# Patient Record
Sex: Male | Born: 1970 | Race: White | Hispanic: No | Marital: Single | Smoking: Current every day smoker
Health system: Southern US, Community
[De-identification: ages and names within clinical notes are randomized; demographics above are authoritative.]

## PROBLEM LIST (undated history)

## (undated) DIAGNOSIS — F410 Panic disorder [episodic paroxysmal anxiety] without agoraphobia: Secondary | ICD-10-CM

## (undated) DIAGNOSIS — J45909 Unspecified asthma, uncomplicated: Secondary | ICD-10-CM

## (undated) DIAGNOSIS — F1911 Other psychoactive substance abuse, in remission: Secondary | ICD-10-CM

## (undated) DIAGNOSIS — Z8701 Personal history of pneumonia (recurrent): Secondary | ICD-10-CM

## (undated) HISTORY — DX: Personal history of pneumonia (recurrent): Z87.01

## (undated) HISTORY — DX: Other psychoactive substance abuse, in remission: F19.11

## (undated) HISTORY — DX: Panic disorder (episodic paroxysmal anxiety): F41.0

## (undated) HISTORY — PX: HERNIA REPAIR: SHX51

---

## 2001-09-24 ENCOUNTER — Encounter: Payer: Self-pay | Admitting: *Deleted

## 2001-09-24 ENCOUNTER — Inpatient Hospital Stay (HOSPITAL_COMMUNITY): Admission: EM | Admit: 2001-09-24 | Discharge: 2001-09-25 | Payer: Self-pay | Admitting: *Deleted

## 2002-02-25 ENCOUNTER — Emergency Department (HOSPITAL_COMMUNITY): Admission: EM | Admit: 2002-02-25 | Discharge: 2002-02-26 | Payer: Self-pay | Admitting: Emergency Medicine

## 2002-04-17 ENCOUNTER — Emergency Department (HOSPITAL_COMMUNITY): Admission: EM | Admit: 2002-04-17 | Discharge: 2002-04-17 | Payer: Self-pay | Admitting: Emergency Medicine

## 2002-09-15 ENCOUNTER — Encounter: Payer: Self-pay | Admitting: *Deleted

## 2002-09-15 ENCOUNTER — Emergency Department (HOSPITAL_COMMUNITY): Admission: EM | Admit: 2002-09-15 | Discharge: 2002-09-15 | Payer: Self-pay | Admitting: *Deleted

## 2002-09-28 ENCOUNTER — Inpatient Hospital Stay (HOSPITAL_COMMUNITY): Admission: EM | Admit: 2002-09-28 | Discharge: 2002-09-30 | Payer: Self-pay | Admitting: Emergency Medicine

## 2002-09-28 ENCOUNTER — Encounter: Payer: Self-pay | Admitting: Emergency Medicine

## 2002-09-30 ENCOUNTER — Encounter: Payer: Self-pay | Admitting: *Deleted

## 2003-09-23 ENCOUNTER — Emergency Department (HOSPITAL_COMMUNITY): Admission: EM | Admit: 2003-09-23 | Discharge: 2003-09-23 | Payer: Self-pay | Admitting: Emergency Medicine

## 2003-11-13 ENCOUNTER — Emergency Department (HOSPITAL_COMMUNITY): Admission: EM | Admit: 2003-11-13 | Discharge: 2003-11-13 | Payer: Self-pay | Admitting: Emergency Medicine

## 2003-11-14 ENCOUNTER — Emergency Department (HOSPITAL_COMMUNITY): Admission: EM | Admit: 2003-11-14 | Discharge: 2003-11-14 | Payer: Self-pay | Admitting: Emergency Medicine

## 2006-02-03 ENCOUNTER — Emergency Department (HOSPITAL_COMMUNITY): Admission: EM | Admit: 2006-02-03 | Discharge: 2006-02-04 | Payer: Self-pay | Admitting: Emergency Medicine

## 2006-02-11 ENCOUNTER — Ambulatory Visit: Payer: Self-pay | Admitting: Orthopedic Surgery

## 2006-02-21 ENCOUNTER — Ambulatory Visit: Payer: Self-pay | Admitting: Orthopedic Surgery

## 2007-01-26 ENCOUNTER — Emergency Department (HOSPITAL_COMMUNITY): Admission: EM | Admit: 2007-01-26 | Discharge: 2007-01-26 | Payer: Self-pay | Admitting: Emergency Medicine

## 2007-07-30 ENCOUNTER — Emergency Department (HOSPITAL_COMMUNITY): Admission: EM | Admit: 2007-07-30 | Discharge: 2007-07-30 | Payer: Self-pay | Admitting: Emergency Medicine

## 2008-11-23 ENCOUNTER — Ambulatory Visit: Payer: Self-pay | Admitting: Cardiology

## 2008-11-23 ENCOUNTER — Inpatient Hospital Stay (HOSPITAL_COMMUNITY): Admission: EM | Admit: 2008-11-23 | Discharge: 2008-11-24 | Payer: Self-pay | Admitting: Emergency Medicine

## 2008-11-24 ENCOUNTER — Encounter: Payer: Self-pay | Admitting: Cardiology

## 2008-11-26 ENCOUNTER — Ambulatory Visit: Payer: Self-pay | Admitting: Cardiology

## 2009-05-16 ENCOUNTER — Emergency Department (HOSPITAL_COMMUNITY): Admission: EM | Admit: 2009-05-16 | Discharge: 2009-05-16 | Payer: Self-pay | Admitting: Emergency Medicine

## 2010-02-21 ENCOUNTER — Emergency Department (HOSPITAL_COMMUNITY): Admission: EM | Admit: 2010-02-21 | Discharge: 2010-02-21 | Payer: Self-pay | Admitting: Emergency Medicine

## 2010-02-24 ENCOUNTER — Inpatient Hospital Stay (HOSPITAL_COMMUNITY): Admission: EM | Admit: 2010-02-24 | Discharge: 2010-03-02 | Payer: Self-pay | Admitting: Emergency Medicine

## 2010-02-25 ENCOUNTER — Ambulatory Visit: Payer: Self-pay | Admitting: Pulmonary Disease

## 2010-11-05 LAB — BASIC METABOLIC PANEL
BUN: 5 mg/dL — ABNORMAL LOW (ref 6–23)
BUN: 6 mg/dL (ref 6–23)
BUN: 6 mg/dL (ref 6–23)
BUN: 8 mg/dL (ref 6–23)
CO2: 24 mEq/L (ref 19–32)
CO2: 25 mEq/L (ref 19–32)
CO2: 25 mEq/L (ref 19–32)
CO2: 29 mEq/L (ref 19–32)
Calcium: 8 mg/dL — ABNORMAL LOW (ref 8.4–10.5)
Calcium: 8.5 mg/dL (ref 8.4–10.5)
Calcium: 8.6 mg/dL (ref 8.4–10.5)
Calcium: 8.6 mg/dL (ref 8.4–10.5)
Chloride: 100 mEq/L (ref 96–112)
Chloride: 102 mEq/L (ref 96–112)
Creatinine, Ser: 0.64 mg/dL (ref 0.4–1.5)
Creatinine, Ser: 0.73 mg/dL (ref 0.4–1.5)
Creatinine, Ser: 0.77 mg/dL (ref 0.4–1.5)
GFR calc Af Amer: >60 mL/min (ref >60)
GFR calc non Af Amer: >60 mL/min (ref >60)
GFR calc non Af Amer: >60 mL/min (ref >60)
GFR calc non Af Amer: >60 mL/min (ref >60)
GFR calc non Af Amer: >60 mL/min (ref >60)
Glucose, Bld: 110 mg/dL — ABNORMAL HIGH (ref 70–99)
Glucose, Bld: 111 mg/dL — ABNORMAL HIGH (ref 70–99)
Glucose, Bld: 127 mg/dL — ABNORMAL HIGH (ref 70–99)
Glucose, Bld: 97 mg/dL (ref 70–99)
Glucose, Bld: 99 mg/dL (ref 70–99)
Potassium: 3.6 mEq/L (ref 3.5–5.1)
Potassium: 3.4 mEq/L — ABNORMAL LOW (ref 3.5–5.1)
Potassium: 3.6 mEq/L (ref 3.5–5.1)
Sodium: 130 mEq/L — ABNORMAL LOW (ref 135–145)
Sodium: 133 mEq/L — ABNORMAL LOW (ref 135–145)
Sodium: 137 mEq/L (ref 135–145)
Sodium: 139 mEq/L (ref 135–145)

## 2010-11-05 LAB — CBC
HCT: 34 % — ABNORMAL LOW (ref 39.0–52.0)
HCT: 35.4 % — ABNORMAL LOW (ref 39.0–52.0)
HCT: 35.6 % — ABNORMAL LOW (ref 39.0–52.0)
Hemoglobin: 11.8 g/dL — ABNORMAL LOW (ref 13.0–17.0)
Hemoglobin: 12 g/dL — ABNORMAL LOW (ref 13.0–17.0)
MCH: 32.2 pg (ref 26.0–34.0)
MCH: 32.6 pg (ref 26.0–34.0)
MCH: 32.8 pg (ref 26.0–34.0)
MCH: 33 pg (ref 26.0–34.0)
MCHC: 33.8 g/dL (ref 30.0–36.0)
MCHC: 34.2 g/dL (ref 30.0–36.0)
MCHC: 34.5 g/dL (ref 30.0–36.0)
MCV: 94.9 fL (ref 78.0–100.0)
MCV: 95 fL (ref 78.0–100.0)
MCV: 95.2 fL (ref 78.0–100.0)
MCV: 95.5 fL (ref 78.0–100.0)
Platelets: 207 10*3/uL (ref 150–400)
Platelets: 414 10*3/uL — ABNORMAL HIGH (ref 150–400)
Platelets: 478 10*3/uL — ABNORMAL HIGH (ref 150–400)
RBC: 4.42 MIL/uL (ref 4.22–5.81)
RBC: 3.5 MIL/uL — ABNORMAL LOW (ref 4.22–5.81)
RDW: 12.7 % (ref 11.5–15.5)
RDW: 12.8 % (ref 11.5–15.5)
RDW: 12.9 % (ref 11.5–15.5)
RDW: 12.9 % (ref 11.5–15.5)
WBC: 17.9 10*3/uL — ABNORMAL HIGH (ref 4.0–10.5)
WBC: 16.9 10*3/uL — ABNORMAL HIGH (ref 4.0–10.5)
WBC: 17.9 10*3/uL — ABNORMAL HIGH (ref 4.0–10.5)

## 2010-11-05 LAB — CULTURE, RESPIRATORY W GRAM STAIN

## 2010-11-05 LAB — URINALYSIS, ROUTINE W REFLEX MICROSCOPIC
Glucose, UA: NEGATIVE mg/dL
Ketones, ur: NEGATIVE mg/dL
Leukocytes, UA: NEGATIVE
Nitrite: NEGATIVE
Protein, ur: 100 mg/dL — AB
Specific Gravity, Urine: >1.03 — ABNORMAL HIGH (ref 1.005–1.030)
pH: 5.5 (ref 5.0–8.0)

## 2010-11-05 LAB — BLOOD GAS, ARTERIAL
Acid-Base Excess: 2.5 mmol/L — ABNORMAL HIGH (ref 0.0–2.0)
Bicarbonate: 26.3 mEq/L — ABNORMAL HIGH (ref 20.0–24.0)
FIO2: 0.32 %
O2 Saturation: 92.6 %
TCO2: 27.5 mmol/L (ref 0–100)
pH, Arterial: 7.443 (ref 7.350–7.450)

## 2010-11-05 LAB — COMPREHENSIVE METABOLIC PANEL
ALT: 23 U/L (ref 0–53)
AST: 29 U/L (ref 0–37)
Alkaline Phosphatase: 95 U/L (ref 39–117)
GFR calc Af Amer: >60 mL/min (ref >60)
Glucose, Bld: 127 mg/dL — ABNORMAL HIGH (ref 70–99)
Potassium: 3.4 mEq/L — ABNORMAL LOW (ref 3.5–5.1)
Sodium: 129 mEq/L — ABNORMAL LOW (ref 135–145)
Total Protein: 6 g/dL (ref 6.0–8.3)

## 2010-11-05 LAB — POCT I-STAT 3, ART BLOOD GAS (G3+)
Acid-Base Excess: 2 mmol/L (ref 0.0–2.0)
Bicarbonate: 27.2 mEq/L — ABNORMAL HIGH (ref 20.0–24.0)
O2 Saturation: 83 %
Patient temperature: 99.6
TCO2: 28 mmol/L (ref 0–100)
pCO2 arterial: 41.4 mmHg (ref 35.0–45.0)
pH, Arterial: 7.412 (ref 7.350–7.450)

## 2010-11-05 LAB — CULTURE, BLOOD (ROUTINE X 2): Culture: NO GROWTH

## 2010-11-05 LAB — EXPECTORATED SPUTUM ASSESSMENT W GRAM STAIN, RFLX TO RESP C

## 2010-11-05 LAB — DIFFERENTIAL
Basophils Absolute: 0 10*3/uL (ref 0.0–0.1)
Basophils Relative: 0 % (ref 0–1)
Eosinophils Absolute: 0 10*3/uL (ref 0.0–0.7)
Eosinophils Relative: 0 % (ref 0–5)
Lymphocytes Relative: 4 % — ABNORMAL LOW (ref 12–46)
Lymphs Abs: 0.7 10*3/uL (ref 0.7–4.0)
Lymphs Abs: 0.7 10*3/uL (ref 0.7–4.0)
Monocytes Absolute: 0.4 10*3/uL (ref 0.1–1.0)
Neutro Abs: 16.7 10*3/uL — ABNORMAL HIGH (ref 1.7–7.7)
Neutro Abs: 17 10*3/uL — ABNORMAL HIGH (ref 1.7–7.7)
Neutrophils Relative %: 95 % — ABNORMAL HIGH (ref 43–77)

## 2010-11-05 LAB — HIV ANTIBODY (ROUTINE TESTING W REFLEX): HIV: NONREACTIVE

## 2010-11-05 LAB — URINE MICROSCOPIC-ADD ON

## 2010-11-05 LAB — RAPID URINE DRUG SCREEN, HOSP PERFORMED
Amphetamines: NOT DETECTED
Cocaine: POSITIVE — AB
Tetrahydrocannabinol: NOT DETECTED

## 2010-11-05 LAB — PHOSPHORUS: Phosphorus: 4.6 mg/dL (ref 2.3–4.6)

## 2010-11-05 LAB — MAGNESIUM
Magnesium: 2.2 mg/dL (ref 1.5–2.5)
Magnesium: 2.2 mg/dL (ref 1.5–2.5)

## 2010-11-05 LAB — STREP PNEUMONIAE URINARY ANTIGEN: Strep Pneumo Urinary Antigen: NEGATIVE

## 2010-11-05 LAB — URINE CULTURE
Colony Count: NO GROWTH
Culture: NO GROWTH

## 2010-11-05 LAB — AFB CULTURE WITH SMEAR (NOT AT ARMC)

## 2010-11-29 LAB — COMPREHENSIVE METABOLIC PANEL
ALT: 26 U/L (ref 0–53)
AST: 23 U/L (ref 0–37)
Albumin: 4.1 g/dL (ref 3.5–5.2)
Calcium: 8.9 mg/dL (ref 8.4–10.5)
GFR calc Af Amer: >60 mL/min (ref >60)
Glucose, Bld: 123 mg/dL — ABNORMAL HIGH (ref 70–99)
Potassium: 3.7 mEq/L (ref 3.5–5.1)
Sodium: 135 mEq/L (ref 135–145)
Total Protein: 7 g/dL (ref 6.0–8.3)

## 2010-11-29 LAB — CBC
HCT: 43.3 % (ref 39.0–52.0)
Hemoglobin: 15.1 g/dL (ref 13.0–17.0)
MCHC: 34.8 g/dL (ref 30.0–36.0)
MCHC: 35.4 g/dL (ref 30.0–36.0)
Platelets: 205 10*3/uL (ref 150–400)
Platelets: 209 10*3/uL (ref 150–400)
RDW: 12.7 % (ref 11.5–15.5)
RDW: 13 % (ref 11.5–15.5)

## 2010-11-29 LAB — POCT CARDIAC MARKERS
CKMB, poc: <1 ng/mL — ABNORMAL LOW (ref 1.0–8.0)
Myoglobin, poc: 25 ng/mL (ref 12–200)

## 2010-11-29 LAB — DIFFERENTIAL
Basophils Absolute: 0 10*3/uL (ref 0.0–0.1)
Basophils Relative: 0 % (ref 0–1)
Eosinophils Absolute: 0.2 10*3/uL (ref 0.0–0.7)
Eosinophils Absolute: 0.2 10*3/uL (ref 0.0–0.7)
Eosinophils Relative: 2 % (ref 0–5)
Lymphs Abs: 2.4 10*3/uL (ref 0.7–4.0)
Monocytes Absolute: 0.6 10*3/uL (ref 0.1–1.0)
Monocytes Absolute: 0.6 10*3/uL (ref 0.1–1.0)
Monocytes Relative: 7 % (ref 3–12)
Neutrophils Relative %: 61 % (ref 43–77)

## 2010-11-29 LAB — BASIC METABOLIC PANEL
BUN: 9 mg/dL (ref 6–23)
CO2: 28 mEq/L (ref 19–32)
GFR calc non Af Amer: >60 mL/min (ref >60)
Glucose, Bld: 103 mg/dL — ABNORMAL HIGH (ref 70–99)
Potassium: 4 mEq/L (ref 3.5–5.1)
Sodium: 138 mEq/L (ref 135–145)

## 2010-11-29 LAB — RAPID URINE DRUG SCREEN, HOSP PERFORMED
Amphetamines: NOT DETECTED
Barbiturates: NOT DETECTED
Benzodiazepines: NOT DETECTED
Tetrahydrocannabinol: NOT DETECTED

## 2010-11-29 LAB — TSH: TSH: 1.947 u[IU]/mL (ref 0.350–4.500)

## 2010-11-29 LAB — LIPID PANEL
HDL: 26 mg/dL — ABNORMAL LOW (ref >39)
Total CHOL/HDL Ratio: 9.5 RATIO
Triglycerides: 424 mg/dL — ABNORMAL HIGH (ref <150)
Triglycerides: 429 mg/dL — ABNORMAL HIGH (ref <150)
VLDL: UNDETERMINED mg/dL (ref 0–40)
VLDL: UNDETERMINED mg/dL (ref 0–40)

## 2010-11-29 LAB — CARDIAC PANEL(CRET KIN+CKTOT+MB+TROPI)
Relative Index: INVALID (ref 0.0–2.5)
Relative Index: INVALID (ref 0.0–2.5)
Total CK: 72 U/L (ref 7–232)
Troponin I: 0.01 ng/mL (ref 0.00–0.06)
Troponin I: 0.02 ng/mL (ref 0.00–0.06)

## 2010-11-29 LAB — D-DIMER, QUANTITATIVE: D-Dimer, Quant: 0.22 ug/mL-FEU (ref 0.00–0.48)

## 2011-01-02 NOTE — Discharge Summary (Signed)
[name], [name] NO.:  [id number]   MEDICAL RECORD NO.:  [id number]          PATIENT TYPE:  INP   LOCATION:  A312                          FACILITY:  [facility]   PHYSICIAN:  Dorris Singh, DO    DATE OF BIRTH:  Jul 10, 1971   DATE OF ADMISSION:  11/23/2008  DATE OF DISCHARGE:  04/07/2010LH                               DISCHARGE SUMMARY   ADMISSION DIAGNOSES:  1. Chest pain.  2. History of anxiety and disorder.  3. History of polysubstance abuse.  4. History of asthma.  5. History of a negative Cardiolite study in 2004.   DISCHARGE DIAGNOSES:  1. Chest pain, atypical.  2. History of tobacco abuse.  3. History of polysubstance abuse.  4. Positive for opiates this admission.  5. Asthma.  6. History of anxiety disorder.   PRIMARY CARE PHYSICIAN:  Patrica Duel, M.D.   CONSULTATIONS:  Gerrit Friends. Dietrich Pates, MD, [facility].   RADIOLOGY TESTING:  On April 6, he had a two view chest x-ray which  demonstrated mild bronchitic changes.  Also, on April 7, he had a 2-D  echo, reading is pending.   HOSPITAL COURSE:  The patient was admitted for the above diagnoses.  He  was admitted to the service of [facility] and placed on the medical floor  with telemetry.  His cardiac enzymes were monitored.  [facility]  was consulted.  The patient was placed on morphine for pain and placed  on DVT and GI prophylaxis.  He continued to improve.  His chest pain  lessened as the day wore on.  He also had a 2-D echo as noted above with  results pending.  Cardiology saw him and felt this was atypical, but  would like to stress him in the office after being discharged within two  days.  At this point in time, it was determined per Cardiology's  recommendation that he could be sent home.   DISCHARGE MEDICATIONS:  The patient will be sent home on the following  medications which include:  1. Lexapro 20 mg one p.o. daily.  2. Aspirin 81 mg p.o. daily.  3. Protonix 40 mg.   DISCHARGE  INSTRUCTIONS:  1. Increase activity slowly.  2. Eat a low sodium, heart-healthy diet.  3. Stop smoking.   PLAN:  He is counseled about his polysubstance abuse.  He is to have a  stress test with Dr. Dietrich Pates on Friday.  He is to take his medications  as directed and to return if symptoms worsen.   CONDITION ON DISCHARGE:  Stable.   DISPOSITION:  To home.   TOTAL TIME OF DISCHARGE:  Thirty minutes.      Dorris Singh, DO  Electronically Signed     CB/MEDQ  D:  11/24/2008  T:  11/24/2008  Job:  [id number]

## 2011-01-02 NOTE — H&P (Signed)
[name]                ACCOUNT NO.:  [id number]   MEDICAL RECORD NO.:  [id number]          PATIENT TYPE:  INP   LOCATION:  A312                          FACILITY:  [facility]   PHYSICIAN:  [name], M.D.DATE OF BIRTH:  03-01-1971   DATE OF ADMISSION:  11/23/2008  DATE OF DISCHARGE:  LH                              HISTORY & PHYSICAL   ADMISSION DIAGNOSIS:  1. Chest pain rule out myocardial infarction.  2. History of anxiety disorder.  3. History of polysubstance abuse.  4. History of asthma.  5. History of negative Cardiolite study in 2004.   CHIEF COMPLAINT:  Chest pain.   HISTORY OF PRESENT ILLNESS:  [name] is a 40 year old male who  presented to the emergency room with complaints of some left-sided chest  pain.   The patient has had this complaint multiple times dating back to 2003.  As far as the records show his pain has been very atypical. He says this  pain is a little bit different.  He says it is mostly on the left side  of his chest and substernal with some pressure with discomfort.  He  denies any nausea or vomiting.  No diaphoresis.  The patient says his  pain was 10 out of 10 at its worst and was nonradiating.  There was no  numbness in his jaw or arms.   He denies any nausea, vomiting.  No abdominal pain.  The patient denies  any history of acid reflux.  Evaluation was done in the emergency room  with no real abnormality found.  His cardiac enzymes were negative.  The  patient reports that his pain is now down to 5/10.  He denies any cough.  No pleuritic like chest pain.   REVIEW OF SYSTEMS:  As mentioned in history of  present illness.  No  weight loss.   PAST MEDICAL HISTORY:  1. Asthma.  2. Prior history of atypical chest pain with negative workup.  3. Polysubstance abuse including cocaine, cannabis, alcohol and      cigarettes.  4. Anxiety and depression.   MEDICATIONS:  The patient does not take any medicines at this time.   ALLERGIES:  He has no known drug allergies.   FAMILY HISTORY:  Noncontributory.   SOCIAL HISTORY:  The patient is a heavy drinker.  He uses marijuana but  denies any recent use of cocaine.  He continues to smoke about one to  two packs of cigarettes a day.   PHYSICAL EXAMINATION:  The patient was conscious, alert, comfortable,  not in acute distress, well-oriented in time, place and person.  VITAL SIGNS:  Blood pressure is 124/71, pulse of 67, respirations 20,  temperature 98 degrees Fahrenheit, oxygen saturation was 95% on room  air.  HEENT:  Exam normocephalic, atraumatic.  Oral mucosa was moist with no  exudates.  NECK:  Supple.  No JVD or lymphadenopathy.  LUNGS:  Clear with good air entry bilaterally.  Heart:  S1, S2, regular.  No S3, gallops or rubs.  ABDOMEN:  Soft, nontender.  Bowel sounds positive.  No masses palpable.  EXTREMITIES:  No edema.  No calf induration or tenderness was noted.  CNS:  Grossly intact with no focal neurological deficits.   LABORATORY AND DIAGNOSTIC DATA:  White blood cell count 8.2, hemoglobin  of 15, hematocrit of 42.5, platelet count was 209 with no left shift.  Sodium is 135, potassium is 3.7, chloride of 102, CO2 was 24, glucose  123, BUN of 12, creatinine was 0.84, AST is 23, ALT of 26, albumin is  4.1, calcium is  8.9.  Cardiac enzymes were negative.  Cardiac markers  were negative.   A 12-lead EKG shows normal sinus rhythm with no acute ST-T changes.  Chest x-ray showed mild bronchitic changes with no acute infiltrates or  effusion noted.   ASSESSMENT AND PLAN:  [name] is a 40 year old male who presents to  the emergency room with chest pain.  the chest pain appears to be  somewhat atypical however  the patient is describing the pain as worse,  as 10 at of 10 and much worse than previous when he had a negative  Cardiolite study.   PLAN:  1. We will admit the patient to the medical floor.  2. Will put the patient on telemetry.  3.  Will cycle his cardiac enzymes.  4. Will request cardiology consult in the morning.  5. Will control pain with morphine as needed.  6. Will put the patient on DVT prophylaxis with Lovenox as I do not      see any clear indication for full anticoagulation at this time.   I have discussed the above plan with the patient.  He verbalized  understanding.  I have also requested a urine drug screen.      [name], M.D.  Electronically Signed     AM/MEDQ  D:  11/23/2008  T:  11/23/2008  Job:  [id number]

## 2011-01-02 NOTE — Consult Note (Signed)
NAMEDEMARYIUS, IMRAN NO.:  [id number]   MEDICAL RECORD NO.:  [id number]          PATIENT TYPE:  INP   LOCATION:  A312                          FACILITY:  [facility]   PHYSICIAN:  Gerrit Friends. Dietrich Pates, MD, FACCDATE OF BIRTH:  [birth year]   DATE OF CONSULTATION:  11/24/2008  DATE OF DISCHARGE:  11/24/2008                                 CONSULTATION   REFERRING PHYSICIAN:  Margaretmary Dys, MD   PRIMARY CARE PHYSICIAN:  Patrica Duel, MD   REASON FOR CONSULTATION:  Chest pain.   HISTORY OF PRESENT ILLNESS:  Mr. Pilger is a 40 year old male patient  with a history of polysubstance abuse, evaluated by Dr. Dorethea Clan in 2004  with cocaine-induced chest pain.  He underwent a Cardiolite study at  that time that demonstrated no ischemia.  He presented to [facility] Room yesterday morning with complaints of right-sided  chest pain.  He described it as a heaviness/pressure.  It began at rest  in the morning shortly after awakening.  He thinks he was slightly short  of breath.  He notes associated nausea and diaphoresis, but not syncope.  He felt like he was also off balance.  He noted a headache in the  occipital region in association with his symptoms.  He continued to feel  worse and finally presented to the emergency room.  No aggravating  factors have been described.  He thinks morphine made the pain feel  better.  He does not recall receiving nitroglycerin in the emergency  room.  His pain has continued to recur since admission with intermittent  relief with morphine sulfate.  Cardiac markers have been negative x3.  We are now asked to further evaluate.   PAST MEDICAL HISTORY:  1. History of chest pain in the setting of cocaine abuse in 2003.  2. Anxiety/depression.  3. History polysubstance abuse.  4. Asthma.  5. Adenosine Cardiolite in February 2004, demonstrating apical      thinning, but no ischemia, EF 61%.  6. Echocardiogram in October 2004,  demonstrate mild LVH, EF 65-70%,      slight left atrial enlargement.  7. Status post left inguinal hernia repair.   MEDICATIONS AT HOME:  Lexapro 20 mg daily.   ALLERGIES:  No known drug allergies.   SOCIAL HISTORY:  The patient lives in [place] with his mother.  He  has a 30+ pack-year history smoking, continues to smoke 1-1/2 packs of  cigarettes per day.  He drinks 6-12 beers per day.  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  He denies any  cocaine abuse in several years.  He continues to admit to marijuana  abuse.   FAMILY HISTORY:  Significant for COPD in his father.  No premature CAD  as noted.  His brother was deceased, but did have tetralogy of Fallot.   REVIEW OF SYSTEMS:  Please see HPI.  Denies fevers, sore throat, rash,  dysuria, hematuria, bright red blood per rectum or melena, dysphasia, or  GERD symptoms.  He has noted some abdominal  pain.  He notes some mild  shortness of breath with exertion without significant change.  He  describes NYHA class II to class IIIB symptoms.  He denies orthopnea,  PND, or pedal edema.  Denies cough.  All other systems reviewed and  negative.   PHYSICAL EXAMINATION:  GENERAL:  He is a well-nourished, well-developed  male in distress.  VITAL SIGNS:  Blood pressure is 124/72, pulse 60, respirations 20,  temperature 97, and oxygen saturation 99% on room air.  HEENT:  Normal.  NECK:  Without JVD.  LYMPH:  Without lymphadenopathy.  ENDOCRINE:  Without thyromegaly.  CARDIAC:  Normal S1 and S2.  Regular rate and rhythm without murmur.  LUNGS:  Clear to auscultation bilaterally.  No wheezing, no rhonchi, no  rales.  SKIN:  Warm and dry without rash.  ABDOMEN:  Soft, nontender with normoactive bowel sounds.  No  organomegaly.  EXTREMITIES:  Without clubbing, cyanosis, or edema.  MUSCULOSKELETAL:  Without joint deformity.  NEUROLOGIC:  He is alert and oriented x3.  Cranial nerves II-XII grossly   intact.  VASCULAR:  No carotid bruits noted bilaterally.  Dorsalis pedis and  posterior tibialis pulses are 2+ bilaterally.   Chest x-ray with mild bronchitic changes.  EKG within normal limits.   LABORATORY DATA:  White count 10,900, hemoglobin 15.1, MCV 95, platelet  count 205,000, potassium 4, creatinine 0.83, and albumin 4.1.  Cardiac  markers negative x3.  Urine drug screen positive for opiates.   ASSESSMENT:  1. Chest pain in a 40 year old male with past history of cocaine abuse      and continuous tobacco abuse as well as alcohol abuse.  Myocardial      infarction has been ruled out.  2. Anxiety/depression.   RECOMMENDATIONS:  The patient presents with mostly atypical chest pain.  He is ruled out for myocardial function by enzymes with negative cardiac  markers.  His chest pain has been ongoing for 24 hours.  An  echocardiogram will be obtained as well as lipid panel and TSH and a D-  dimer.  The patient was also interviewed  and examined by Dr. Dietrich Pates.  After further review, Dr. Dietrich Pates has  felt the patient is stable enough for discharge to home.  He will be set  up for an outpatient stress test to further evaluate.  Thank you very  much for the consultation.      Tereso Newcomer, PA-C      Gerrit Friends. Dietrich Pates, MD, [facility]  Electronically Signed    SW/MEDQ  D:  11/26/2008  T:  11/26/2008  Job:  [id number]   cc:   Patrica Duel, M.D.  Fax: [phone]   Margaretmary Dys, M.D.

## 2011-01-02 NOTE — Group Therapy Note (Signed)
NAMEGERTRUDE, [name] NO.:  [id number]   MEDICAL RECORD NO.:  [id number]          PATIENT TYPE:  INP   LOCATION:  A312                          FACILITY:  [facility]   PHYSICIAN:  Johnny Singh, DO    DATE OF BIRTH:  May 28, 1971   DATE OF PROCEDURE:  11/24/2008  DATE OF DISCHARGE:                                 PROGRESS NOTE   The patient is seen today.  He is still complaining of some chest  discomfort.  He had an echo done and we are awaiting any further  recommendations via cardiology.   PHYSICAL EXAMINATION:  VITAL SIGNS:  His vitals are as follows.  Temperature 98.1, pulse 60, respirations 20 and blood pressure 124/72.  GENERAL:  Generally he is well-developed, well-nourished and in no acute  distress.  HEART:  Regular rate and rhythm.  LUNGS:  Clear to auscultation bilaterally.  ABDOMEN:  Soft, nontender, nondistended.  EXTREMITIES:  Positive pulses.  No edema, ecchymosis or cyanosis.   CURRENT LABS:  White count of 10.9, hemoglobin 15.1, hematocrit 43.3 and  platelet count 205.  He did test positive for opiates and his sodium is  138, potassium 4.0, chloride 105, CO2 28, glucose 103, BUN 9, creatinine  0.83.   ASSESSMENT AND PLAN:  1. Chest pain, rule out myocardial infarction.  So far all of his      troponins have been negative.  2. Anxiety disorder seems to be stable.  3. History of polysubstance abuse.  4. History of asthma.   Will continue to monitor and await any further recommendations that  cardiology may have for him.      Johnny Singh, DO  Electronically Signed     CB/MEDQ  D:  11/24/2008  T:  11/24/2008  Job:  [id number]

## 2011-01-02 NOTE — Procedures (Signed)
[facility]                              EXERCISE TREADMILL   [name], [name]                       MRN:          [id number]  DATE:11/26/2008                            DOB:          12-24-1970    PRIMARY CARE PHYSICIAN:  Patrica Duel, M.D.   REASON FOR STRESS TEST:  Chest pain.   HISTORY OF PRESENT ILLNESS:  Mr. Frith is a 40 year old male patient  with a history of tobacco abuse and alcohol abuse who had previously  abused cocaine, but denies any cocaine use in quite sometime who  presented to [facility] recently with chest pain.  It was  atypical, and he ruled out for myocardial infarction by enzymes.  His D-  dimer was normal.  His TSH was normal.  Chest x-ray was unremarkable.  Echocardiogram demonstrated normal LV function.  His lipid panel was  abnormal with elevated triglycerides over 400 and an HDL under 30.   EXERCISE TREADMILL TEST:  The patient exercised according to Bruce  Protocol for 10 minutes and achieved a work level of 11.8 METS.  His  resting heart rate of 86 rose to a maximum of 166 beats per minute,  which represented 91% of his maximal age predicted heart rate.  Resting  blood pressure is 140/82 and rose to a maximum of 188/72.  Exercise was  stopped due to achieving greater than 85% of his maximum potential heart  rate as well as exhaustion.  The patient complained of chest pain at the  beginning of the test.  This was slightly worsened during the test.  He  also noticed shortness of breath with exertion.   FINDINGS:  His baseline electrocardiogram demonstrated sinus rhythm with  a heart rate of 89, normal axis, no acute changes.  He had no  significant ST-T wave changes during exercise to suggest ischemia or  injury.  He had no significant arrhythmias in recovery.   IMPRESSION:  1. Electrically negative exercise treadmill test.  2. Tobacco abuse.  3. Alcohol abuse.  4. Dyslipidemia with elevated  triglycerides and low HDL.   RECOMMENDATIONS:  No further cardiovascular workup in warranted.  The  patient's exercise treadmill test was reviewed with Dr. Dietrich Pates today.  He can follow up with Cardiology on a p.r.n. basis.  He has been asked  to reduce his alcohol intake, and have his lipids rechecked with his  primary care physician within 3 months.  He should follow up with Dr.  Nobie Putnam for further evaluation and management of his chest discomfort.   DISPOSITION:  Follow up with Cardiology on a p.r.n. basis.      Tereso Newcomer, PA-C  Electronically Signed      Gerrit Friends. Dietrich Pates, MD, [facility]  Electronically Signed   SW/MedQ  DD: 11/26/2008  DT: 11/27/2008  Job #: [id number]   cc:   Patrica Duel, M.D.

## 2011-01-05 NOTE — Procedures (Signed)
   NAME:  [name], [name]                          ACCOUNT NO.:  [id number]   MEDICAL RECORD NO.:  [id number]                   PATIENT TYPE:   LOCATION:                                       FACILITY:  [facility]   PHYSICIAN:  Vida Roller, M.D.                DATE OF BIRTH:  1971/04/30   DATE OF PROCEDURE:  09/29/2002  DATE OF DISCHARGE:                                  ECHOCARDIOGRAM   REFERRING PHYSICIAN:  Patrica Duel, M.D.   TAPE NUMBERShearon Stalls [id number]   CLINICAL INFORMATION:  This is a 40 year old white male with chest pain,  asthma, and hypertension.   MEASUREMENTS M-MODE:  1. The aorta is 32 mm.  2. The left atrium is 42 mm which is mildly enlarged.  3. The septum is 15 mm which is enlarged.  4. The posterior wall is 13 mm which is enlarged.  5. The left ventricular diastolic dimension is 48 mm.  6. The left ventricular systolic dimension is 31 mm.   2-D AND DOPPLER IMAGING:  The left ventricle is normal size with mild  concentric left ventricular hypertrophy and there is no evidence of  significant wall motion abnormalities.  Diastolic function is normal.  Estimated ejection fraction is 65-70%.   The right ventricle is normal size with normal systolic function.   The right atrium is normal size.  The left atrium is slightly enlarged.  There are no atrial septal defects.   The aortic valve is trileaflet, tricommissural with no evidence of stenosis  or regurgitation.   The mitral valve is morphologically unremarkable with no stenosis or  regurgitation.   The tricuspid valve is morphologically normal with no stenosis or  regurgitation.   No pulmonic regurgitation is seen.   The pericardial structures are normal.                                               Vida Roller, M.D.    JH/MEDQ  D:  09/29/2002  T:  09/29/2002  Job:  [id number]

## 2011-01-05 NOTE — H&P (Signed)
NAME:  [name], [name]                          ACCOUNT NO.:  [id number]   MEDICAL RECORD NO.:  [id number]                   PATIENT TYPE:  EMS   LOCATION:  ED                                   FACILITY:  [facility]   PHYSICIAN:  Sarita Bottom, M.D.                  DATE OF BIRTH:  May 23, 1971   DATE OF ADMISSION:  09/28/2002  DATE OF DISCHARGE:                                HISTORY & PHYSICAL   PRIMARY CARE PHYSICIAN:  Patrica Duel, M.D..   CHIEF COMPLAINT:  I have chest pain.   HISTORY OF PRESENT ILLNESS:  The patient is a 40 year old man with a history  of childhood asthma.  He has a history of anxiety disorder and he has had  previous ER visits for cocaine-induced chest pain.  He was well until this  morning when he developed a left-sided chest pain which was unusual,  initially 9/10 and has since gone down to 7/10.  The pain that he describes  as sharp, is nonradiating.  It was associated with nausea and some shortness  of breath.  He was not doing any strenuous activity at the time.  He denies  any palpitations or any fever.  Denies any vomiting, any diarrhea, any  abdominal pain, any dizziness.   REVIEW OF SYSTEMS:  As per HPI.  All other systems reviewed and were  negative.   PAST MEDICAL HISTORY:  He has a history of anxiety disorder.  He has asthma  since childbirth.  He has never had any attacks since he grew up.  He has a  history of a substance abuse but denies taking any cocaine at this time.  He  had a stress test done before but according to him the test was never  completed.   CURRENT MEDICATIONS:  1. Xanax 0.5 mg b.i.d.  2. Paxil 10 mg q.i.d.   ALLERGIES:  He has no known drug allergies.   SOCIAL HISTORY:  He is separated.  He has no children.  He smokes about 1-2  packs per day.  He drinks beer regularly.  He is an ex-cocaine user.   FAMILY HISTORY:  He said his father had heart disease when he 27 years old  and he also has a brother with a pacemaker and a  tetralogy of Fallot.   PHYSICAL EXAMINATION:  VITAL SIGNS:  BP 128/58, heart rate of 67,  respiratory rate of 8 and temperature is 98.2.  GENERAL:  This is a young man, lying comfortably on a stretcher not in any  apparent distress.  HEENT:  He is not pale.  He is anicteric.  Pupils equal and reactive to  light and accommodation.  NECK:  Supple.  There is no lymphadenopathy.  There is no thyromegaly.  CHEST:  Clear to auscultation.  CARDIOVASCULAR:  Heart sounds S1 & S2 are normal.  Rhythm is regular, rate  is normal.  He has no murmurs.  ABDOMEN:  Soft, bowel sounds are present. No masses or organomegaly.  CENTRAL NERVOUS SYSTEM:  He is alert and oriented x3.  He has no gross or  focal deficits.  EXTREMITIES:  He has no edema.   LABORATORY AND DIAGNOSTIC DATA:  PTT 29, PT 12.9, INR 0.9.  LFTs are normal.  Cardiac troponin-I is 0.1, CK is 100, MB is 1.6.  Hemoglobin 14.9,  hematocrit 44, WBC 11.3, MCV 92, platelets 221.  Sodium 136, potassium 3.8,  BUN 10, creatinine 0.8, glucose 103, calcium 9.6.   His EKG is in sinus rhythm at 76 beats per minute.  Normal electrical axis,  normal intervals.  He has some small Q waves in V2 to V6.  He has no acute  ST-T wave changes.   ASSESSMENT/PLAN:  1. Chest pain.  The patient will be admitted to rule out acute myocardial     infarction.  Will do serial cardiac enzymes and EKG x2.  Will do a urine     drug screen at this time because of his previous history of cocaine     abuse.  We will also do an electric profile for him at this time.  He     will also be put on aspirin 325 daily, and nitroglycerin 0.4 mg     sublingually p.r.n., and metoprolol 25 mg b.i.d.  We will also call a     cardiology consult for a possible stress test.  2. With a history of anxiety disorder, the patient will be continued with     the Paxil 10 mg daily and also with the Xanax 0.5 mg b.i.d. p.r.n.  The     patient will be admitted under the primary M.D., Dr. Patrica Duel.   Further workup and management will depend on clinical course.                                               Sarita Bottom, M.D.    DW/MEDQ  D:  09/28/2002  T:  09/28/2002  Job:  [id number]

## 2011-01-05 NOTE — Consult Note (Signed)
NAME:  [name], [name]                          ACCOUNT NO.:  [id number]   MEDICAL RECORD NO.:  [id number]                   PATIENT TYPE:   LOCATION:  217                                  FACILITY:  [facility]   PHYSICIAN:  Vida Roller, M.D.                DATE OF BIRTH:  09-07-1970   DATE OF CONSULTATION:  09/29/2002  DATE OF DISCHARGE:                                   CONSULTATION   REASON FOR THE CONSULT:  Ongoing chest discomfort.   HISTORY OF PRESENT ILLNESS:  [name] is a 40 year old white male with a  history of significant substance abuse and an anxiety disorder who presented  to the emergency department with cocaine-induced chest discomfort.  He was  seen in the ER on February 9 with a history of chest pain.  He reports  constant, sort of mild tightness with worsening of the pain yesterday.  He  reports the onset while sitting down and working on a truck.  He describes  associated shortness of breath, nausea, a little bit of diaphoresis, no  vomiting, no radiation of the pain.  He reports that these symptoms have  been similar to those in the past.  He was admitted for rule out myocardial  infarction, denies any current active drug abuse.  He is currently without  significant pain.   PAST MEDICAL HISTORY:  Significant for the anxiety disorder, childhood  asthma, and substance abuse with previous ER visits for cocaine-induced  chest pain and an incomplete evaluation for chest pain in the past, with an  incomplete exercise stress test.   ALLERGIES:  He is not allergic to any medications.   CURRENT MEDICATIONS:  Upon admission, were Xanax 0.5 mg b.i.d., Paxil 10 mg  q.i.d., and Vioxx.  In the hospital the addition of aspirin and metoprolol  was added.   SOCIAL HISTORY:  He lives in [place] with his mother.  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  He has a 20+ pack/year smoking  history and is an active smoker.  He drinks alcohol on a regular  basis, as  much as a 6-pack a day.  He relates a history of cocaine use, not presently  using however and has not in the last year, according to his history.   FAMILY HISTORY:  Significant for his father having significant coronary  artery disease at the age of 81 and a brother with tetralogy of Fallot  requiring a pacemaker.   SURGICAL HISTORY:  No surgical history.   REVIEW OF SYSTEMS:  Essentially noncontributory except for occasional  numbness in his lower extremities which is not related to his chest pain.   PHYSICAL EXAMINATION:  He is a well-nourished, well-developed, mildly obese  white male in no apparent distress.  He was alert and oriented x4 and a  pretty good historian.  His temperature was 97.7, heart rate was 62,  respiration rate was 20, and his blood pressure was 130/60.  HEENT:  Unremarkable.  NECK:  Supple, with no jugular venous distension or carotid bruits.  There  is no lymphadenopathy noted.  CARDIAC EXAM:  Non-displaced point of maximal impulse with no evidence of  riffs or thrills.  First and second heart sounds are normal.  There is no  third heart sound.  There is a fourth heart sound.  There are no murmurs  noted.  LUNGS:  Clear to auscultation bilaterally.  ABDOMEN:  Soft, nontender, with normoactive bowel sounds.  SKIN:  Within normal limits, and specifically there is no evidence of  chronic embolic phenomenon consistent with either IV drug abuse or  endocarditis.  EXTREMITIES:  His lower extremities reveal no clubbing, cyanosis, or edema.  His hands have significant wear and tear from his occupation but there are  no Osler's nodes or Janeway lesions.  MUSCULOSKELETAL EXAM:  Normal.  NEUROLOGIC EXAM:  Nonfocal.  Her is alert and oriented x4.   LABORATORY DATA:  Chest x-ray shows hypo inflation with bibasilar  atelectasis but no significant cardiopulmonary findings.  Electrocardiogram  is sinus bradycardia at a rate of 56 with a normal axis, normal  intervals,  with a slightly prolonged QRS duration at 112 milliseconds.  Voltage  criteria for left ventricular hypertrophy and small Q-waves in the inferior  leads which appear to be non-diagnostic.  No acute ST T-wave changes  consistent with ischemia.  His white blood cell counts 13.3, hemoglobin and hematocrit are 15 and 45  and his platelet count is 231,000.  Sodium of 136, potassium of 3.8,  chloride of 107, bicarbonate of 24.  BUN and creatinine are 10 and 0.8  respectively, and his blood sugar is 103.  Liver function studies are within  normal limits.  He has 3 sets of cardiac enzymes which are negative for  acute myocardial infarction.  Prothrombin time of 12.9, partial  thromboplastin time of 29, with an INR of 0.9.  His lipids and his urine  drug screen are both pending.   ASSESSMENT:  1. This is a gentleman with chest discomfort who has multiple cardiac risk     factors, most significantly his relatively recent cocaine use.  This is     concerning for the ongoing chest discomfort.  At this point, I think with     him being pain free it is relatively straightforward to evaluate him in a     noninvasive manner through an echocardiogram to assess the degree of left     ventricular hypertrophy and a perfusion imaging scan with adenosine as he     is unable to ambulate adequately to achieve a maximum predicted heart     rate.  His tobacco use and hypertension as well as his family history are     also concerning cardiac risk factors.  If either one of these tests are     abnormal, I will refer him for an emergent heart catheterization.  If the     Cardiolite and the echocardiogram that we are going to do both come back     without any significant abnormalities, I still think he probably needs a     left heart catheterization with coronary angiography as an outpatient. 2. His anxiety disorder which of course exacerbated by his substance abuse     problems.  He is on Paxil and Xanax  for that and appears to be reasonably     well controlled  here in the hospital.  His tobacco abuse is obviously     concerning and we will get him involved in a smoking cessation program     and the hypertension is addressed adequately with the metoprolol.  Of     note is the fact that if indeed he does have cocaine-induced chest     discomfort and his urine drug screen comes back positive for cocaine,     beta blockers may be contraindicated in this condition and consideration     may be to switch over to either a calcium channel blocker or a long-     acting nitrate.                                                Vida Roller, M.D.    JH/MEDQ  D:  09/29/2002  T:  09/29/2002  Job:  [id number]

## 2011-01-05 NOTE — H&P (Signed)
[facility]  Patient:    [name], [name] Visit Number: [id number] MRN: [id number]          Service Type: MED Location: 2A A220 01 Attending Physician:  Rosalyn Charters Dictated by:   Patrica Duel, M.D. Admit Date:  09/23/2001                           History and Physical  CHIEF COMPLAINT:  Chest pain.  HISTORY OF PRESENT ILLNESS:  This is a 40 year old male, with a history of anxiety and depression, who had been stable on Paxil and occasional Xanax.  He has a remote history of drug abuse but has been relatively "straight" for the past seven years.  He is also status post inguinal hernia repair x2 on the left by Dr. Lovell Sheehan.  The patient is recently separated from his wife and has been upset, according to his mother.  He obtained 2 g of cocaine on the street.  He consumed this by snorting over a period of one hour.  He developed substernal chest pain and shortness of breath, and was brought to the hospital for evaluation.  His initial work-up was benign except for sinus tachycardia.  He is admitted for further evaluation and therapy of chest pain, cocaine-induced.  He has no history of headache, neurologic deficits, nausea, vomiting, diarrhea, melena, hematemesis, hematochezia, syncope, or genitourinary symptoms.  CURRENT MEDICATIONS:  1. Paxil.  2. Xanax.  ALLERGIES:  None known.  PAST MEDICAL HISTORY:  As noted above.  FAMILY HISTORY:  Positive for coronary disease in multiple paternal relatives. One brother has congenital heart disease and has undergone multiple procedures.  No definite coronary disease in that individual.  REVIEW OF SYSTEMS:  Negative except as mentioned.  PHYSICAL EXAMINATION:  GENERAL:  Pleasant, somewhat depressed male in no acute distress.  VITAL SIGNS:  On presentation TEMP 98.5 degrees, heart rate 112, respirations 40, blood pressure 166/89.  HEENT:  Normocephalic, atraumatic.  PERRL.  Ears, nose, throat  benign.  NECK:  Supple.  No bruits, thyromegaly, lymphadenopathy.  LUNGS:  Clear to A&P.  CARDIAC:  Heart sounds normal without murmurs, rubs, or gallops.  ABDOMEN:  Nontender, nondistended.  Bowel sounds intact.  EXTREMITIES:  No clubbing, cyanosis, or edema.  NEUROLOGIC:  No focal deficits.  LABORATORY DATA:  EKG - sinus tach, rate 112, incomplete right bundle branch block, small lateral Q waves - probably insignificant; no ST-T changes of concern.  Initial cardiac enzymes negative.  ASSESSMENT:  Chest pain after ingestion of large amount of cocaine, probably coronary spasm though with family history and risk factors including smoking and male sex, unknown lipid status, consider fixed lesion.  PLAN:  Admit for rule out MI protocol.  Cardiology will consult with [place] and will follow and treat expectantly.   Dictated by:   Patrica Duel, M.D.  Attending Physician:  Rosalyn Charters DD:  09/24/01 TD:  09/24/01 Job: [id number]

## 2011-01-05 NOTE — Procedures (Signed)
   NAME:  [name], [name]                          ACCOUNT NO.:  [id number]   MEDICAL RECORD NO.:  [id number]                   PATIENT TYPE:   LOCATION:  217                                  FACILITY:  [facility]   PHYSICIAN:  Vida Roller, M.D.                DATE OF BIRTH:  September 17, 1970   DATE OF PROCEDURE:  09/30/2002  DATE OF DISCHARGE:                                  ECHOCARDIOGRAM   PROCEDURE:  Adenosine Cardiolite.   INDICATION:  This is a 40 year old male with no known coronary artery  disease, with recurrent chest tightness for the last six months, with  worsening of pain recently.  He does have multiple cardiac risk factors  including male sex, positive tobacco, hypertension, hyperlipidemia and  positive family history.   BASELINE DATA:  EKG:  Sinus rhythm at 72 beats per minute with nonspecific  ST changes.  Blood pressure 128/80.   DESCRIPTION OF PROCEDURE:  Adenosine 54 mg was infused over 4 minutes with  Cardiolite injected at 3 minutes.  The patient reported shortness of breath  and funny feeling all over the body which resolved in recovery.  EKG showed  no arrhythmias and no ischemic changes.  Blood pressure was 124/72.   IMPRESSION:  Final images and results are  pending M.D. review.     Amy Mercy Riding, P.A. [facility]                     Vida Roller, M.D.    AB/MEDQ  D:  09/30/2002  T:  09/30/2002  Job:  [id number]

## 2011-01-05 NOTE — Procedures (Signed)
   NAME:  [name], [name]                          ACCOUNT NO.:  [id number]   MEDICAL RECORD NO.:  [id number]                   PATIENT TYPE:  INP   LOCATION:  A217                                 FACILITY:  [facility]   PHYSICIAN:  Edward L. Juanetta Gosling, M.D.             DATE OF BIRTH:  12-15-1970   DATE OF PROCEDURE:  09/28/2002  DATE OF DISCHARGE:                                EKG INTERPRETATION   TIME/DATE:  2230, September 28, 2002.   DESCRIPTION OF PROCEDURE:  The rhythm is sinus bradycardia with a rate in  the 50s.  There is left ventricular hypertrophy.  Q-waves are seen  inferiorly which may indicate previous inferior myocardial infarction.  It  may be related to LVH.  There is marked T-wave flattening laterally.   IMPRESSION:  Abnormal electrocardiogram.                                               Edward L. Juanetta Gosling, M.D.    ELH/MEDQ  D:  09/29/2002  T:  09/29/2002  Job:  [id number]

## 2011-01-05 NOTE — Procedures (Signed)
   NAME:  [name], [name]                          ACCOUNT NO.:  [id number]   MEDICAL RECORD NO.:  [id number]                   PATIENT TYPE:  INP   LOCATION:  A217                                 FACILITY:  [facility]   PHYSICIAN:  Edward L. Juanetta Gosling, M.D.             DATE OF BIRTH:  Aug 14, 1971   DATE OF PROCEDURE:  09/28/2002  DATE OF DISCHARGE:                                EKG INTERPRETATION   PROCEDURE:  EKG.   INTERPRETATION:  The rhythm is a sinus rhythm with a rate of 70.  There are  small Q-waves inferiorly which could indicate previous myocardial  infarction.  These are rather small and may be of no significance but  clinical correlation is suggested.                                               Edward L. Juanetta Gosling, M.D.    ELH/MEDQ  D:  09/28/2002  T:  09/28/2002  Job:  [id number]

## 2011-01-05 NOTE — Discharge Summary (Signed)
[facility]  Patient:    [name], AUMENT Visit Number: [id number] MRN: [id number]          Service Type: MED Location: 2A A220 01 Attending Physician:  Patrica Duel Dictated by:   Patrica Duel, M.D. Admit Date:  09/23/2001 Discharge Date: 09/25/2001                             Discharge Summary  DISCHARGE DIAGNOSES: 1. Cocaine induced chest pain, rule out MI protocol negative. 2. Chronic anxiety and depression. 3. Hypertension controlled. 4. History of anxiety and depression stable on Paxil and occasional Xanax. 5. Status post right inguinal hernia repair x 2 on the left.  NOTE:  For details regarding admission please refer to the admitting note.  Briefly this 40 year old male with the above history obtained 2 grams of cocaine on the street.  Apparently he was upset as he is recently separated from his wife.  He consumed this by snorting over a period of 1 hour.  He developed substernal chest pain and shortness of breath and was brought to the hospital for evaluation.  His initial work-up was benign except for sinus tachycardia.  He was admitted for further evaluation and therapy for chest pain which is probably cocaine induced.  HOSPITAL COURSE:  The patient did well in the hospital without recurrent chest pain.  Rule out MI protocol as negative.  He was seen by mental health and he agreed to go to [facility] for further therapy as indicated.  He was stable for discharge on the second hospital day.  DISPOSITION:  MEDICATIONS: 1. Amlodipine 5 mg q.d. 2. Xanax 0.5 q.6. p.r.n. 3. Aspirin 325 q.d.  FOLLOWUP:  He will be followed up and treated as an outpatient. Dictated by:   Patrica Duel, M.D. Attending Physician:  Patrica Duel DD:  10/09/01 TD:  10/09/01 Job: [id number]

## 2011-01-05 NOTE — Consult Note (Signed)
   NAME:  [name], [name]                          ACCOUNT NO.:  [id number]   MEDICAL RECORD NO.:  [id number]                   PATIENT TYPE:  INP   LOCATION:  A217                                 FACILITY:  [facility]   PHYSICIAN:  Patrica Duel, M.D.                 DATE OF BIRTH:  [birth year]   DATE OF CONSULTATION:  DATE OF DISCHARGE:  09/30/2002                                   CONSULTATION   DISCHARGE DIAGNOSIS:  Noncardiac chest pain.   HISTORY:  For details of admission, please refer to the admitting history  and physical.  This 40 year old male with history of asthma and anxiety  disorder and previous admissions for cocaine-induced chest pain, developed  recurrent chest pain.  He came to the ER for evaluation.  His workup was  essentially negative.  He was admitted for rule out protocol and further  evaluation as indicated.   HOSPITAL COURSE:  The patient while in the hospital, Dr. Dorethea Clan saw the  patient, and he set up a Cardiolite study and he agreed that the chest pain  was atypical.  The Cardiolite study was negative and the patient was stable  for discharge to follow as an outpatient.                                               Patrica Duel, M.D.    MC/MEDQ  D:  10/26/2002  T:  10/26/2002  Job:  [id number]

## 2011-01-05 NOTE — Procedures (Signed)
   NAME:  [name], [name]                          ACCOUNT NO.:  [id number]   MEDICAL RECORD NO.:  [id number]                   PATIENT TYPE:  INP   LOCATION:  A217                                 FACILITY:  [facility]   PHYSICIAN:  Edward L. Juanetta Gosling, M.D.             DATE OF BIRTH:  26-Sep-1970   DATE OF PROCEDURE:  09/29/2002  DATE OF DISCHARGE:                                EKG INTERPRETATION   TIME/DATE:  4540, September 29, 2002.   DESCRIPTION OF PROCEDURE:  The rhythm is sinus bradycardia with a rate in  the 50s.  There is possible left ventricular hypertrophy.  Small Q-waves are  seen inferiorly which could indicate previous injury, and infarction could  be related to the LVH.  There is marked T-wave flattening laterally which  may be due to ischemia.                                               Edward L. Juanetta Gosling, M.D.    ELH/MEDQ  D:  09/29/2002  T:  09/29/2002  Job:  [id number]

## 2011-04-25 ENCOUNTER — Emergency Department (HOSPITAL_COMMUNITY): Payer: Self-pay

## 2011-04-25 ENCOUNTER — Emergency Department (HOSPITAL_COMMUNITY)
Admission: EM | Admit: 2011-04-25 | Discharge: 2011-04-25 | Disposition: A | Discharge status: Home / Self Care | Payer: Self-pay | Attending: Emergency Medicine | Admitting: Emergency Medicine

## 2011-04-25 DIAGNOSIS — J40 Bronchitis, not specified as acute or chronic: Secondary | ICD-10-CM | POA: Insufficient documentation

## 2011-04-25 DIAGNOSIS — R079 Chest pain, unspecified: Secondary | ICD-10-CM | POA: Insufficient documentation

## 2011-04-25 DIAGNOSIS — R05 Cough: Secondary | ICD-10-CM | POA: Insufficient documentation

## 2011-04-25 DIAGNOSIS — J3489 Other specified disorders of nose and nasal sinuses: Secondary | ICD-10-CM | POA: Insufficient documentation

## 2011-04-25 DIAGNOSIS — F341 Dysthymic disorder: Secondary | ICD-10-CM | POA: Insufficient documentation

## 2011-04-25 DIAGNOSIS — Z79899 Other long term (current) drug therapy: Secondary | ICD-10-CM | POA: Insufficient documentation

## 2011-04-25 DIAGNOSIS — R059 Cough, unspecified: Secondary | ICD-10-CM | POA: Insufficient documentation

## 2011-06-07 LAB — BASIC METABOLIC PANEL
Chloride: 103
Creatinine, Ser: 0.77
GFR calc Af Amer: >60
Potassium: 4
Sodium: 138

## 2011-06-07 LAB — CBC
Hemoglobin: 15.2
MCV: 91.5
RBC: 4.65
WBC: 9.9

## 2011-06-07 LAB — POCT CARDIAC MARKERS
CKMB, poc: <1 — ABNORMAL LOW
Myoglobin, poc: 33.4
Operator id: 236211
Troponin i, poc: <0.05

## 2011-06-07 LAB — DIFFERENTIAL
Eosinophils Absolute: 0.1
Lymphs Abs: 2.4
Monocytes Absolute: 0.7
Monocytes Relative: 7
Neutrophils Relative %: 68

## 2011-06-07 LAB — URINALYSIS, ROUTINE W REFLEX MICROSCOPIC
Protein, ur: NEGATIVE
Urobilinogen, UA: 0.2

## 2011-09-15 ENCOUNTER — Emergency Department (HOSPITAL_COMMUNITY)
Admission: EM | Admit: 2011-09-15 | Discharge: 2011-09-16 | Disposition: A | Discharge status: Home / Self Care | Payer: Self-pay | Attending: Emergency Medicine | Admitting: Emergency Medicine

## 2011-09-15 ENCOUNTER — Encounter (HOSPITAL_COMMUNITY): Payer: Self-pay

## 2011-09-15 ENCOUNTER — Emergency Department (HOSPITAL_COMMUNITY): Payer: Self-pay

## 2011-09-15 DIAGNOSIS — R51 Headache: Secondary | ICD-10-CM | POA: Insufficient documentation

## 2011-09-15 DIAGNOSIS — R079 Chest pain, unspecified: Secondary | ICD-10-CM | POA: Insufficient documentation

## 2011-09-15 DIAGNOSIS — J3489 Other specified disorders of nose and nasal sinuses: Secondary | ICD-10-CM | POA: Insufficient documentation

## 2011-09-15 DIAGNOSIS — R509 Fever, unspecified: Secondary | ICD-10-CM | POA: Insufficient documentation

## 2011-09-15 DIAGNOSIS — J40 Bronchitis, not specified as acute or chronic: Secondary | ICD-10-CM

## 2011-09-15 DIAGNOSIS — Z79899 Other long term (current) drug therapy: Secondary | ICD-10-CM | POA: Insufficient documentation

## 2011-09-15 DIAGNOSIS — R05 Cough: Secondary | ICD-10-CM | POA: Insufficient documentation

## 2011-09-15 DIAGNOSIS — R059 Cough, unspecified: Secondary | ICD-10-CM | POA: Insufficient documentation

## 2011-09-15 MED ORDER — ALBUTEROL SULFATE HFA 108 (90 BASE) MCG/ACT IN AERS
2.0000 | INHALATION_SPRAY | Freq: Four times a day (QID) | RESPIRATORY_TRACT | Status: DC
  Administered 2011-09-15: 2 via RESPIRATORY_TRACT
  Filled 2011-09-15: qty 6.7

## 2011-09-15 MED ORDER — PREDNISONE 20 MG PO TABS
40.0000 mg | ORAL_TABLET | Freq: Once | ORAL | Status: AC
  Administered 2011-09-15: 40 mg via ORAL
  Filled 2011-09-15: qty 2

## 2011-09-15 MED ORDER — ALBUTEROL SULFATE (5 MG/ML) 0.5% IN NEBU
5.0000 mg | INHALATION_SOLUTION | Freq: Once | RESPIRATORY_TRACT | Status: AC
  Administered 2011-09-15: 5 mg via RESPIRATORY_TRACT
  Filled 2011-09-15: qty 1

## 2011-09-15 MED ORDER — IPRATROPIUM BROMIDE 0.02 % IN SOLN
0.5000 mg | Freq: Once | RESPIRATORY_TRACT | Status: AC
  Administered 2011-09-15: 0.5 mg via RESPIRATORY_TRACT
  Filled 2011-09-15: qty 2.5

## 2011-09-15 NOTE — ED Notes (Signed)
Pt brought in by family for cough, nasal congestion,headache, fever, body aches, and chills x 1 week.

## 2011-09-15 NOTE — ED Provider Notes (Signed)
History     CSN: [id number]  Arrival date & time 09/15/11  2157   First MD Initiated Contact with Patient 09/15/11 2221      Chief Complaint  Patient presents with  . Cough  . Headache  . Fever  . Generalized Body Aches  . Nasal Congestion  . Shortness of Breath    (Consider location/radiation/quality/duration/timing/severity/associated sxs/prior treatment) HPI Comments: Patient complains of productive cough, headache, fever, nasal congestion and generalized body aches for approximately one week. He also c/o shortness of breath with excessive cough.   He was seen by his primary care physician 4 days ago for same symptoms.  He was treated with Augmentin and cough medication.  He states the symptoms are not improving.    Patient is a 41 y.o. male presenting with cough, headaches, fever, and shortness of breath. The history is provided by the patient and a parent. No language interpreter was used.  Cough This is a new problem. The current episode started more than 2 days ago. The problem occurs every few minutes. The problem has not changed since onset.The cough is productive of sputum. The maximum temperature recorded prior to his arrival was 100 to 100.9 F. Associated symptoms include headaches, rhinorrhea, myalgias and shortness of breath. Pertinent negatives include no sore throat and no wheezing. He has tried nothing for the symptoms. The treatment provided no relief. He is not a smoker. His past medical history is significant for pneumonia. His past medical history does not include bronchitis, COPD or asthma.  Headache  This is a new problem. The current episode started 2 days ago. Episode frequency: intermittently. The problem has not changed since onset.The headache is associated with coughing. The pain is located in the bilateral region. The quality of the pain is described as dull. The pain is mild. The pain does not radiate. Associated symptoms include a fever and shortness of  breath. Pertinent negatives include no chest pressure, no palpitations, no syncope, no nausea and no vomiting. He has tried nothing for the symptoms. The treatment provided no relief.  Fever Primary symptoms of the febrile illness include fever, headaches, cough, shortness of breath and myalgias. Primary symptoms do not include wheezing, abdominal pain, nausea, vomiting, diarrhea or rash. The current episode started 6 to 7 days ago. This is a new problem. The problem has not changed since onset. The headache is not associated with weakness.  The patient's medical history does not include COPD or asthma.  The myalgias are not associated with weakness.  Shortness of Breath  Associated symptoms include a fever, rhinorrhea, cough and shortness of breath. Pertinent negatives include no chest pressure, no sore throat and no wheezing. His past medical history does not include asthma.    Past Medical History  Diagnosis Date  . Pneumonia     Past Surgical History  Procedure Date  . Hernia repair     History reviewed. No pertinent family history.  History  Substance Use Topics  . Smoking status: Former Games developer  . Smokeless tobacco: Not on file  . Alcohol Use: Yes     occasional       Review of Systems  Constitutional: Positive for fever.  HENT: Positive for congestion and rhinorrhea. Negative for sore throat.   Respiratory: Positive for cough, chest tightness and shortness of breath. Negative for wheezing.   Cardiovascular: Negative for palpitations and syncope.  Gastrointestinal: Negative for nausea, vomiting, abdominal pain and diarrhea.  Musculoskeletal: Positive for myalgias.  Skin: Negative.  Negative for rash.  Neurological: Positive for headaches. Negative for dizziness, weakness and numbness.  Hematological: Negative for adenopathy.  All other systems reviewed and are negative.    Allergies  Review of patient's allergies indicates no known allergies.  Home Medications    Current Outpatient Rx  Name Route Sig Dispense Refill  . AMOXICILLIN-POT CLAVULANATE 875-125 MG PO TABS Oral Take 1 tablet by mouth 2 (two) times daily. For 10 days patient started on 09/11/11    . CITALOPRAM HYDROBROMIDE 20 MG PO TABS Oral Take 20 mg by mouth daily.    Marland Kitchen CLONAZEPAM 2 MG PO TABS Oral Take 1 mg by mouth daily as needed. Anxiety/panic attacks    . GUAIFENESIN-CODEINE 100-10 MG/5ML PO SYRP Oral Take 5 mLs by mouth every 6 (six) hours as needed. cough      BP 146/75  Pulse 95  Temp 99.8 F (37.7 C)  Resp 24  Ht 5\' 10"  (1.778 m)  Wt 210 lb 1 oz (95.284 kg)  BMI 30.14 kg/m2  SpO2 95%  Physical Exam  Nursing note and vitals reviewed. Constitutional: He is oriented to person, place, and time. He appears well-developed and well-nourished. No distress.  HENT:  Head: Normocephalic and atraumatic.  Mouth/Throat: Oropharynx is clear and moist.  Neck: Normal range of motion. Neck supple.  Cardiovascular: Normal rate, regular rhythm and normal heart sounds.   Pulmonary/Chest: Effort normal. No respiratory distress. He has no wheezes. He has no rales. He exhibits tenderness.       Coarse lung sounds bilaterally  Abdominal: Soft. He exhibits no distension. There is no tenderness.  Musculoskeletal: Normal range of motion. He exhibits no tenderness.  Lymphadenopathy:    He has no cervical adenopathy.  Neurological: He is alert and oriented to person, place, and time. No cranial nerve deficit. He exhibits normal muscle tone. Coordination normal.  Skin: Skin is warm and dry.    ED Course  Procedures (including critical care time)    Dg Chest 2 View  09/15/2011  *RADIOLOGY REPORT*  Clinical Data: Fever, cough, and congestion for 1 week  CHEST - 2 VIEW  Comparison: 04/25/2011  Findings: Normal heart size and pulmonary vascularity.  Mild peribronchial thickening with some interstitial changes in the right lung base suggesting bronchitis with possible interstitial pneumonia. No  focal airspace consolidation in the lungs. Emphysematous changes and scattered fibrosis in the lungs.  No blunting of costophrenic angles.  No pneumothorax.  Calcified granulomas in the right lung base.  IMPRESSION: Peribronchial thickening and interstitial changes suggesting bronchitis.  No focal airspace consolidation or effusion. Emphysematous changes.  Original Report Authenticated By: Marlon Pel, M.D.     MDM   Patient is alert and stable. Nontoxic appearing. Lung sounds were coarse bilaterally. No wheezing or rales.  No tachycardia, tachypnea or hypoxia to suggest PE  Patient already taking antibiotic  I will prescribe steroids and dispense an inhaler.  Pt agrees to close f/u with his PMD  Patient / Family / Caregiver understand and agree with initial ED impression and plan with expectations set for ED visit. Pt stable in ED with no significant deterioration in condition.        Latavion Halls L. [place], [place] 09/17/11 2327

## 2011-09-16 MED ORDER — HYDROCODONE-ACETAMINOPHEN 5-325 MG PO TABS
2.0000 | ORAL_TABLET | Freq: Once | ORAL | Status: AC
  Administered 2011-09-16: 2 via ORAL
  Filled 2011-09-16: qty 2

## 2011-09-16 MED ORDER — PREDNISONE 10 MG PO TABS: ORAL_TABLET | ORAL | Status: DC

## 2011-09-18 NOTE — ED Provider Notes (Signed)
Medical screening examination/treatment/procedure(s) were performed by non-physician practitioner and as supervising physician I was immediately available for consultation/collaboration.   Dayton Bailiff, MD 09/18/11 [phone]

## 2013-06-18 ENCOUNTER — Encounter (HOSPITAL_COMMUNITY): Payer: Self-pay | Admitting: Emergency Medicine

## 2013-06-18 ENCOUNTER — Emergency Department (HOSPITAL_COMMUNITY)
Admission: EM | Admit: 2013-06-18 | Discharge: 2013-06-18 | Disposition: A | Discharge status: Home / Self Care | Payer: Self-pay | Attending: Emergency Medicine | Admitting: Emergency Medicine

## 2013-06-18 ENCOUNTER — Emergency Department (HOSPITAL_COMMUNITY): Payer: Self-pay

## 2013-06-18 DIAGNOSIS — Z79899 Other long term (current) drug therapy: Secondary | ICD-10-CM | POA: Insufficient documentation

## 2013-06-18 DIAGNOSIS — F17200 Nicotine dependence, unspecified, uncomplicated: Secondary | ICD-10-CM | POA: Insufficient documentation

## 2013-06-18 DIAGNOSIS — R110 Nausea: Secondary | ICD-10-CM | POA: Insufficient documentation

## 2013-06-18 DIAGNOSIS — I10 Essential (primary) hypertension: Secondary | ICD-10-CM | POA: Insufficient documentation

## 2013-06-18 DIAGNOSIS — R42 Dizziness and giddiness: Secondary | ICD-10-CM | POA: Insufficient documentation

## 2013-06-18 DIAGNOSIS — F410 Panic disorder [episodic paroxysmal anxiety] without agoraphobia: Secondary | ICD-10-CM | POA: Insufficient documentation

## 2013-06-18 DIAGNOSIS — R079 Chest pain, unspecified: Secondary | ICD-10-CM

## 2013-06-18 DIAGNOSIS — R61 Generalized hyperhidrosis: Secondary | ICD-10-CM | POA: Insufficient documentation

## 2013-06-18 DIAGNOSIS — R0789 Other chest pain: Secondary | ICD-10-CM | POA: Insufficient documentation

## 2013-06-18 DIAGNOSIS — R0602 Shortness of breath: Secondary | ICD-10-CM | POA: Insufficient documentation

## 2013-06-18 DIAGNOSIS — Z8701 Personal history of pneumonia (recurrent): Secondary | ICD-10-CM | POA: Insufficient documentation

## 2013-06-18 LAB — BASIC METABOLIC PANEL
BUN: 9 mg/dL (ref 6–23)
Chloride: 102 mEq/L (ref 96–112)
Creatinine, Ser: 0.72 mg/dL (ref 0.50–1.35)
GFR calc Af Amer: >90 mL/min (ref >90)
GFR calc non Af Amer: >90 mL/min (ref >90)
Potassium: 4 mEq/L (ref 3.5–5.1)

## 2013-06-18 LAB — CBC WITH DIFFERENTIAL/PLATELET
Basophils Absolute: 0 10*3/uL (ref 0.0–0.1)
Basophils Relative: 0 % (ref 0–1)
HCT: 45 % (ref 39.0–52.0)
Lymphocytes Relative: 26 % (ref 12–46)
MCHC: 34 g/dL (ref 30.0–36.0)
Neutro Abs: 3.8 10*3/uL (ref 1.7–7.7)
Neutrophils Relative %: 65 % (ref 43–77)
RDW: 13.3 % (ref 11.5–15.5)
WBC: 5.8 10*3/uL (ref 4.0–10.5)

## 2013-06-18 LAB — TROPONIN I
Troponin I: <0.3 ng/mL (ref <0.30)
Troponin I: <0.3 ng/mL (ref <0.30)

## 2013-06-18 MED ORDER — ASPIRIN 81 MG PO CHEW
324.0000 mg | CHEWABLE_TABLET | Freq: Once | ORAL | Status: AC
  Administered 2013-06-18: 324 mg via ORAL
  Filled 2013-06-18: qty 4

## 2013-06-18 MED ORDER — NITROGLYCERIN 0.4 MG SL SUBL
0.4000 mg | SUBLINGUAL_TABLET | SUBLINGUAL | Status: DC | PRN
  Administered 2013-06-18 (×2): 0.4 mg via SUBLINGUAL
  Filled 2013-06-18: qty 25

## 2013-06-18 NOTE — Progress Notes (Signed)
ED/CM noted patient did not have health insurance and/or PCP listed in the computer.  Patient was given the [facility] with information on the clinics, food pantries, and the handout for new health insurance sign-up.  Patient was asleep. Information left in room.

## 2013-06-18 NOTE — ED Notes (Signed)
Pt reports chest tightness is relieved. nad noted. Pt refused third dose of nitro.

## 2013-06-18 NOTE — ED Notes (Signed)
Left sided chest tightness that started one hour ago.  Associated with sob.  States he has panic attacks but this feels different.

## 2013-06-18 NOTE — ED Provider Notes (Addendum)
CSN: [id number]     Arrival date & time 06/18/13  1106 History  This chart was scribed for Vida Roller, MD by Blanchard Kelch, ED Scribe. The patient was seen in room APA12/APA12. Patient's care was started at 11:36 AM.    Chief Complaint  Patient presents with  . Chest Pain    Patient is a 42 y.o. male presenting with chest pain. The history is provided by the patient. No language interpreter was used.  Chest Pain Associated symptoms: diaphoresis, nausea and shortness of breath   Associated symptoms: no cough and no fever     HPI Comments: [name] is a 42 y.o. male who presents to the Emergency Department complaining of waxing and waning chest pain that began an hour ago. He describes the pain as tightness. The pain does not radiate down his arm or up his jaw. The pain onsets suddenly and then gradually subsides after about 10-15 minutes. He has associated shortness of breath, diaphoresis, and dizziness with the episodes.  He had a stress test done a year or two ago and patient states it was normal. The symptoms were similar at that time.He has a past medical history of panic attacks but states this pain feels different. He is a current every day smoker (1.5-2 packs per day). He denies drug use. His brother had a pacemaker placed at 68 years old but has since passed away. He denies a past medical history of heart problems, hypertension, diabetes or hyperlipidemia. He denies any recent injury or trauma to his chest. He denies fever, cough, vomiting, or dysuria.   Past Medical History  Diagnosis Date  . Pneumonia   . Panic attack    Past Surgical History  Procedure Laterality Date  . Hernia repair     History reviewed. No pertinent family history. History  Substance Use Topics  . Smoking status: Current Every Day Smoker  . Smokeless tobacco: Not on file  . Alcohol Use: Yes     Comment: daily 2 or more beers.     Review of Systems  Constitutional: Positive for  diaphoresis. Negative for fever.  Respiratory: Positive for shortness of breath. Negative for cough.   Cardiovascular: Positive for chest pain.  Gastrointestinal: Positive for nausea.  Genitourinary: Negative for dysuria.  All other systems reviewed and are negative.    Allergies  Review of patient's allergies indicates no known allergies.  Home Medications   Current Outpatient Rx  Name  Route  Sig  Dispense  Refill  . citalopram (CELEXA) 20 MG tablet   Oral   Take 20 mg by mouth daily.         . clonazePAM (KLONOPIN) 2 MG tablet   Oral   Take 1 mg by mouth daily as needed. Anxiety/panic attacks         . vitamin B-12 (CYANOCOBALAMIN) 1000 MCG tablet   Oral   Take 1,000 mcg by mouth daily.          Triage Vitals: BP 157/106  Pulse 70  Temp(Src) 98 F (36.7 C) (Oral)  SpO2 97%  Physical Exam  Nursing note and vitals reviewed. Constitutional: He appears well-developed and well-nourished. No distress.  HENT:  Head: Normocephalic and atraumatic.  Mouth/Throat: Oropharynx is clear and moist. No oropharyngeal exudate.  Eyes: Conjunctivae and EOM are normal. Pupils are equal, round, and reactive to light. Right eye exhibits no discharge. Left eye exhibits no discharge. No scleral icterus.  Neck: Normal range of motion. Neck supple.  No JVD present. No thyromegaly present.  Cardiovascular: Normal rate, regular rhythm, normal heart sounds and intact distal pulses.  Exam reveals no gallop and no friction rub.   No murmur heard. Pulmonary/Chest: Effort normal and breath sounds normal. No respiratory distress. He has no wheezes. He has no rales.  Abdominal: Soft. Bowel sounds are normal. He exhibits no distension and no mass. There is no tenderness.  Musculoskeletal: Normal range of motion. He exhibits no edema and no tenderness.  Lymphadenopathy:    He has no cervical adenopathy.  Neurological: He is alert. Coordination normal.  Skin: Skin is warm and dry. No rash noted.  No erythema.  Psychiatric: He has a normal mood and affect. His behavior is normal.    ED Course  Procedures (including critical care time)  DIAGNOSTIC STUDIES: Oxygen Saturation is 97% on room air, adequate by my interpretation.    COORDINATION OF CARE: 11:43 AM -Will order BMP, CBC, Troponin I, aspirin and Nitroglycerin tablet. Patient verbalizes understanding and agrees with treatment plan.   Labs Review Labs Reviewed  BASIC METABOLIC PANEL - Abnormal; Notable for the following:    Glucose, Bld 105 (*)    All other components within normal limits  CBC WITH DIFFERENTIAL  TROPONIN I  TROPONIN I   Imaging Review Dg Chest Port 1 View  06/18/2013   CLINICAL DATA:  Chest pain. Panic attack.  EXAM: PORTABLE CHEST - 1 VIEW  COMPARISON:  09/15/2011  FINDINGS: Midline trachea. Normal heart size for level of inspiration. No pleural effusion or pneumothorax. Low lung volumes with resultant vascular crowding at the lung bases especially on the right. Suspect a calcified granuloma at the right lung base.  IMPRESSION: Diminished lung volumes with resultant vascular crowding and volume loss at the lung bases. No acute findings.   Electronically Signed   By: Jeronimo Greaves M.D.   On: 06/18/2013 12:22    EKG Interpretation     Ventricular Rate:  79 PR Interval:  158 QRS Duration: 102 QT Interval:  384 QTC Calculation: 440 R Axis:   36 Text Interpretation:  Normal sinus rhythm Nonspecific T wave abnormality Abnormal ECG When compared with ECG of 25-Apr-2011 18:27, Nonspecific T wave abnormality no longer evident in Anterior leads            ED ECG REPORT  I personally interpreted this EKG   Date: 06/18/2013   Rate: 79  Rhythm: normal sinus rhythm  QRS Axis: normal  Intervals: normal  ST/T Wave abnormalities: normal  Conduction Disutrbances:none  Narrative Interpretation:   Old EKG Reviewed: unchanged   MDM   1. Chest pain   2. Hypertension    Pt has normal exam, ECG's  normal X 2 - trop normal X 2 and he is now pain free - non exertional sx.  Unlikely to be CAD - possible panic related, stable appearing for d/c  Xray normal, labs normal, VS with  Mild htn - d/w pt re: getting rechecked in 2 weeks for BP    Filed Vitals:   06/18/13 1300 06/18/13 1325 06/18/13 1400 06/18/13 1500  BP: 146/78 148/93 141/77 151/93  Pulse: 76 84 71 76  Temp:      TempSrc:      Resp: 21 16 19 20   SpO2: 90% 96% 94% 96%    Meds given in ED:  Medications  nitroGLYCERIN (NITROSTAT) SL tablet 0.4 mg (0.4 mg Sublingual Given 06/18/13 1208)  aspirin chewable tablet 324 mg (324 mg Oral Given 06/18/13 1159)  New Prescriptions   No medications on file    I personally performed the services described in this documentation, which was scribed in my presence. The recorded information has been reviewed and is accurate.      Vida Roller, MD 06/18/13 1507  Vida Roller, MD 06/24/13 [phone]

## 2014-11-04 ENCOUNTER — Emergency Department (HOSPITAL_COMMUNITY): Payer: Self-pay

## 2014-11-04 ENCOUNTER — Emergency Department (HOSPITAL_COMMUNITY)
Admission: EM | Admit: 2014-11-04 | Discharge: 2014-11-04 | Disposition: A | Discharge status: Home / Self Care | Payer: Self-pay | Attending: Emergency Medicine | Admitting: Emergency Medicine

## 2014-11-04 ENCOUNTER — Encounter (HOSPITAL_COMMUNITY): Payer: Self-pay | Admitting: *Deleted

## 2014-11-04 DIAGNOSIS — F410 Panic disorder [episodic paroxysmal anxiety] without agoraphobia: Secondary | ICD-10-CM | POA: Insufficient documentation

## 2014-11-04 DIAGNOSIS — J45909 Unspecified asthma, uncomplicated: Secondary | ICD-10-CM | POA: Insufficient documentation

## 2014-11-04 DIAGNOSIS — J159 Unspecified bacterial pneumonia: Secondary | ICD-10-CM | POA: Insufficient documentation

## 2014-11-04 DIAGNOSIS — Z79899 Other long term (current) drug therapy: Secondary | ICD-10-CM | POA: Insufficient documentation

## 2014-11-04 DIAGNOSIS — Z720 Tobacco use: Secondary | ICD-10-CM | POA: Insufficient documentation

## 2014-11-04 DIAGNOSIS — J189 Pneumonia, unspecified organism: Secondary | ICD-10-CM

## 2014-11-04 HISTORY — DX: Unspecified asthma, uncomplicated: J45.909

## 2014-11-04 LAB — CBC
HCT: 44.9 % (ref 39.0–52.0)
Hemoglobin: 15.6 g/dL (ref 13.0–17.0)
MCH: 32.8 pg (ref 26.0–34.0)
MCHC: 34.7 g/dL (ref 30.0–36.0)
MCV: 94.3 fL (ref 78.0–100.0)
PLATELETS: 178 10*3/uL (ref 150–400)
RBC: 4.76 MIL/uL (ref 4.22–5.81)
RDW: 12.4 % (ref 11.5–15.5)
WBC: 7.5 10*3/uL (ref 4.0–10.5)

## 2014-11-04 LAB — BASIC METABOLIC PANEL
Anion gap: 7 (ref 5–15)
BUN: 13 mg/dL (ref 6–23)
CALCIUM: 9.1 mg/dL (ref 8.4–10.5)
CO2: 25 mmol/L (ref 19–32)
Chloride: 105 mmol/L (ref 96–112)
Creatinine, Ser: 0.85 mg/dL (ref 0.50–1.35)
GLUCOSE: 129 mg/dL — AB (ref 70–99)
Potassium: 3.8 mmol/L (ref 3.5–5.1)
SODIUM: 137 mmol/L (ref 135–145)

## 2014-11-04 LAB — TROPONIN I

## 2014-11-04 MED ORDER — KETOROLAC TROMETHAMINE 30 MG/ML IJ SOLN
30.0000 mg | Freq: Once | INTRAMUSCULAR | Status: AC
  Administered 2014-11-04: 30 mg via INTRAVENOUS
  Filled 2014-11-04: qty 1

## 2014-11-04 MED ORDER — MORPHINE SULFATE 4 MG/ML IJ SOLN
4.0000 mg | Freq: Once | INTRAMUSCULAR | Status: AC
  Administered 2014-11-04: 4 mg via INTRAVENOUS
  Filled 2014-11-04: qty 1

## 2014-11-04 MED ORDER — SODIUM CHLORIDE 0.9 % IV BOLUS (SEPSIS)
1000.0000 mL | Freq: Once | INTRAVENOUS | Status: AC
  Administered 2014-11-04: 1000 mL via INTRAVENOUS

## 2014-11-04 MED ORDER — OXYCODONE-ACETAMINOPHEN 5-325 MG PO TABS: 2.0000 | ORAL_TABLET | ORAL | Status: DC | PRN

## 2014-11-04 MED ORDER — HYDROMORPHONE HCL 1 MG/ML IJ SOLN
1.0000 mg | Freq: Once | INTRAMUSCULAR | Status: AC
  Administered 2014-11-04: 1 mg via INTRAVENOUS
  Filled 2014-11-04: qty 1

## 2014-11-04 MED ORDER — AZITHROMYCIN 250 MG PO TABS: ORAL_TABLET | ORAL | Status: DC

## 2014-11-04 MED ORDER — CEFTRIAXONE SODIUM 1 G IJ SOLR
1.0000 g | Freq: Once | INTRAMUSCULAR | Status: AC
  Administered 2014-11-04: 1 g via INTRAVENOUS
  Filled 2014-11-04: qty 10

## 2014-11-04 MED ORDER — DEXTROSE 5 % IV SOLN
500.0000 mg | Freq: Once | INTRAVENOUS | Status: AC
  Administered 2014-11-04: 500 mg via INTRAVENOUS
  Filled 2014-11-04: qty 500

## 2014-11-04 NOTE — Discharge Instructions (Signed)
You have pneumonia in your left lung. Stop smoking. Increase fluids. Start antibiotic prescription tomorrow afternoon. Prescription for pain medicine. Recommend staying out of work until Monday

## 2014-11-04 NOTE — ED Notes (Signed)
Pt states pain to left chest since Monday. Described as sharp and constant, "Like a knife sticking through all the way to my back" Pt states pain is worse with taking a deep breath and with movement.

## 2014-11-04 NOTE — ED Provider Notes (Signed)
CSN: [id number]     Arrival date & time 11/04/14  1157 History  This chart was scribed for Johnny HutchingBrian Ellie Bryand, MD by Luisa DagoPriscilla Guerra, Medical Scribe. This patient was seen in room APA08/APA08 and the patient's care was started at 1:38 PM.    Chief Complaint  Patient presents with  . Chest Pain   The history is provided by the patient and medical records. No language interpreter was used.   HPI Comments: Scherrie NovemberBryan K Guerra is a 44 y.o. male with PMhx of asthma and tobacco use (pt smokes about 1 pack a day) presents to the Emergency Department complaining of sudden onset gradually worsening persistent left sided chest pain that started 3 days at work. He describes the pain as "sharp" in nature and radiating to his back. Pt states the pain is worsened by movement, exertion, coughing, and deep breathing. He endorses an associated productive cough with yellow sputum. Pt denies any fever, neck pain, sore throat, visual disturbance, cough, SOB, abdominal pain, nausea, emesis, diarrhea, urinary symptoms, back pain, HA, weakness, numbness and rash as associated symptoms.    Past Medical History  Diagnosis Date  . Pneumonia   . Panic attack   . Asthma    Past Surgical History  Procedure Laterality Date  . Hernia repair     No family history on file. History  Substance Use Topics  . Smoking status: Current Every Day Smoker  . Smokeless tobacco: Not on file  . Alcohol Use: Yes     Comment: daily 2 or more beers.     Review of Systems  Cardiovascular: Positive for chest pain.  A complete 10 system review of systems was obtained and all systems are negative except as noted in the HPI and PMH.  Allergies  Review of patient's allergies indicates no known allergies.  Home Medications   Prior to Admission medications   Medication Sig Start Date End Date Taking? Authorizing Provider  citalopram (CELEXA) 40 MG tablet Take 40 mg by mouth daily.   Yes Historical Provider, MD  clonazePAM (KLONOPIN) 2 MG tablet  Take 1 mg by mouth daily as needed. Anxiety/panic attacks   Yes Historical Provider, MD  vitamin B-12 (CYANOCOBALAMIN) 1000 MCG tablet Take 1,000 mcg by mouth daily.   Yes Historical Provider, MD  azithromycin (ZITHROMAX Z-PAK) 250 MG tablet 2 po day one, then 1 daily x 4 days 11/04/14   Johnny HutchingBrian Brynlei Klausner, MD  oxyCODONE-acetaminophen (PERCOCET) 5-325 MG per tablet Take 2 tablets by mouth every 4 (four) hours as needed. 11/04/14   Johnny HutchingBrian Davianna Deutschman, MD   BP 133/84 mmHg  Pulse 85  Temp(Src) 98.1 F (36.7 C) (Oral)  Resp 18  Ht 5\' 8"  (1.727 m)  Wt 210 lb (95.255 kg)  BMI 31.94 kg/m2  SpO2 97%  Physical Exam  Constitutional: He is oriented to person, place, and time. He appears well-developed and well-nourished.  HENT:  Head: Normocephalic and atraumatic.  Eyes: Conjunctivae and EOM are normal. Pupils are equal, round, and reactive to light.  Neck: Normal range of motion. Neck supple.  Cardiovascular: Normal rate and regular rhythm.   Pulmonary/Chest: Effort normal and breath sounds normal. He exhibits tenderness.  Tender to palpation to the anterior chest.   Abdominal: Soft. Bowel sounds are normal.  Musculoskeletal: Normal range of motion.  Neurological: He is alert and oriented to person, place, and time.  Skin: Skin is warm and dry.  Psychiatric: He has a normal mood and affect. His behavior is normal.  Nursing note  and vitals reviewed.   ED Course  Procedures (including critical care time)  DIAGNOSTIC STUDIES: Oxygen Saturation is 97% on RA,  normal by my interpretation.    COORDINATION OF CARE: 1:42 PM- Will order CXR. Pt advised of plan for treatment and pt agrees.  Results for orders placed or performed during the hospital encounter of 11/04/14  CBC  Result Value Ref Range   WBC 7.5 4.0 - 10.5 K/uL   RBC 4.76 4.22 - 5.81 MIL/uL   Hemoglobin 15.6 13.0 - 17.0 g/dL   HCT 16.1 09.6 - 04.5 %   MCV 94.3 78.0 - 100.0 fL   MCH 32.8 26.0 - 34.0 pg   MCHC 34.7 30.0 - 36.0 g/dL   RDW 40.9  81.1 - 91.4 %   Platelets 178 150 - 400 K/uL  Basic metabolic panel  Result Value Ref Range   Sodium 137 135 - 145 mmol/L   Potassium 3.8 3.5 - 5.1 mmol/L   Chloride 105 96 - 112 mmol/L   CO2 25 19 - 32 mmol/L   Glucose, Bld 129 (H) 70 - 99 mg/dL   BUN 13 6 - 23 mg/dL   Creatinine, Ser 7.82 0.50 - 1.35 mg/dL   Calcium 9.1 8.4 - 95.6 mg/dL   GFR calc non Af Amer >90 >90 mL/min   GFR calc Af Amer >90 >90 mL/min   Anion gap 7 5 - 15  Troponin I (MHP)  Result Value Ref Range   Troponin I <0.03 <0.031 ng/mL   Dg Chest 2 View  11/04/2014   CLINICAL DATA:  Left-sided chest pain for 3 days.  EXAM: CHEST  2 VIEW  COMPARISON:  June 18, 2013.  FINDINGS: The heart size and mediastinal contours are within normal limits. No pneumothorax or pleural effusion is noted. Stable small calcified granuloma seen laterally in right lung base. New retrocardiac left lower lobe opacity is noted concerning for probable pneumonia. The visualized skeletal structures are unremarkable.  IMPRESSION: Retrocardiac left lower lobe opacity is noted concerning for pneumonia. Followup radiographs are recommended until resolution.   Electronically Signed   By: Lupita Raider, M.D.   On: 11/04/2014 13:29    MDM   Final diagnoses:  Community acquired pneumonia    Chest x-ray shows a retrocardiac left lower lobe opacity. History and physical consistent with community-acquired pneumonia. IV fluids. IV Zithromax, IV Rocephin. Patient can be treated as an outpatient. Discharge medications Zithromax and Percocet for pain.  I personally performed the services described in this documentation, which was scribed in my presence. The recorded information has been reviewed and is accurate.    Johnny Hutching, MD 11/04/14 [phone]

## 2014-11-10 ENCOUNTER — Encounter (HOSPITAL_COMMUNITY): Payer: Self-pay | Admitting: *Deleted

## 2014-11-10 ENCOUNTER — Emergency Department (HOSPITAL_COMMUNITY): Payer: Self-pay

## 2014-11-10 ENCOUNTER — Emergency Department (HOSPITAL_COMMUNITY)
Admission: EM | Admit: 2014-11-10 | Discharge: 2014-11-10 | Disposition: A | Discharge status: Home / Self Care | Payer: Self-pay | Attending: Emergency Medicine | Admitting: Emergency Medicine

## 2014-11-10 DIAGNOSIS — Z720 Tobacco use: Secondary | ICD-10-CM | POA: Insufficient documentation

## 2014-11-10 DIAGNOSIS — Z79899 Other long term (current) drug therapy: Secondary | ICD-10-CM | POA: Insufficient documentation

## 2014-11-10 DIAGNOSIS — J189 Pneumonia, unspecified organism: Secondary | ICD-10-CM

## 2014-11-10 DIAGNOSIS — F410 Panic disorder [episodic paroxysmal anxiety] without agoraphobia: Secondary | ICD-10-CM | POA: Insufficient documentation

## 2014-11-10 DIAGNOSIS — R079 Chest pain, unspecified: Secondary | ICD-10-CM | POA: Insufficient documentation

## 2014-11-10 DIAGNOSIS — Z792 Long term (current) use of antibiotics: Secondary | ICD-10-CM | POA: Insufficient documentation

## 2014-11-10 DIAGNOSIS — J159 Unspecified bacterial pneumonia: Secondary | ICD-10-CM | POA: Insufficient documentation

## 2014-11-10 DIAGNOSIS — J45901 Unspecified asthma with (acute) exacerbation: Secondary | ICD-10-CM | POA: Insufficient documentation

## 2014-11-10 LAB — BASIC METABOLIC PANEL
Anion gap: 8 (ref 5–15)
BUN: 10 mg/dL (ref 6–23)
CHLORIDE: 104 mmol/L (ref 96–112)
CO2: 24 mmol/L (ref 19–32)
Calcium: 9.3 mg/dL (ref 8.4–10.5)
Creatinine, Ser: 0.9 mg/dL (ref 0.50–1.35)
GFR calc Af Amer: >90 mL/min (ref >90)
GFR calc non Af Amer: >90 mL/min (ref >90)
Glucose, Bld: 157 mg/dL — ABNORMAL HIGH (ref 70–99)
Potassium: 3.4 mmol/L — ABNORMAL LOW (ref 3.5–5.1)
Sodium: 136 mmol/L (ref 135–145)

## 2014-11-10 LAB — CBC WITH DIFFERENTIAL/PLATELET
Basophils Absolute: 0 10*3/uL (ref 0.0–0.1)
Basophils Relative: 0 % (ref 0–1)
EOS PCT: 2 % (ref 0–5)
Eosinophils Absolute: 0.2 10*3/uL (ref 0.0–0.7)
HEMATOCRIT: 46.2 % (ref 39.0–52.0)
Hemoglobin: 16.4 g/dL (ref 13.0–17.0)
LYMPHS ABS: 2.3 10*3/uL (ref 0.7–4.0)
LYMPHS PCT: 31 % (ref 12–46)
MCH: 32.7 pg (ref 26.0–34.0)
MCHC: 35.5 g/dL (ref 30.0–36.0)
MCV: 92.2 fL (ref 78.0–100.0)
MONOS PCT: 4 % (ref 3–12)
Monocytes Absolute: 0.3 10*3/uL (ref 0.1–1.0)
Neutro Abs: 4.7 10*3/uL (ref 1.7–7.7)
Neutrophils Relative %: 63 % (ref 43–77)
PLATELETS: 224 10*3/uL (ref 150–400)
RBC: 5.01 MIL/uL (ref 4.22–5.81)
RDW: 12.1 % (ref 11.5–15.5)
WBC: 7.5 10*3/uL (ref 4.0–10.5)

## 2014-11-10 LAB — TROPONIN I: Troponin I: <0.03 ng/mL (ref <0.031)

## 2014-11-10 MED ORDER — ALBUTEROL SULFATE HFA 108 (90 BASE) MCG/ACT IN AERS: 1.0000 | INHALATION_SPRAY | Freq: Four times a day (QID) | RESPIRATORY_TRACT | Status: DC | PRN

## 2014-11-10 MED ORDER — OXYCODONE-ACETAMINOPHEN 5-325 MG PO TABS: 2.0000 | ORAL_TABLET | ORAL | Status: DC | PRN

## 2014-11-10 MED ORDER — KETOROLAC TROMETHAMINE 30 MG/ML IJ SOLN
30.0000 mg | Freq: Once | INTRAMUSCULAR | Status: AC
  Administered 2014-11-10: 30 mg via INTRAVENOUS
  Filled 2014-11-10: qty 1

## 2014-11-10 MED ORDER — LEVOFLOXACIN 500 MG PO TABS: 500.0000 mg | ORAL_TABLET | Freq: Every day | ORAL | Status: DC

## 2014-11-10 MED ORDER — METHYLPREDNISOLONE SODIUM SUCC 125 MG IJ SOLR
125.0000 mg | Freq: Once | INTRAMUSCULAR | Status: AC
  Administered 2014-11-10: 125 mg via INTRAVENOUS
  Filled 2014-11-10: qty 2

## 2014-11-10 MED ORDER — HYDROMORPHONE HCL 1 MG/ML IJ SOLN
1.0000 mg | Freq: Once | INTRAMUSCULAR | Status: DC
  Filled 2014-11-10: qty 1

## 2014-11-10 MED ORDER — SODIUM CHLORIDE 0.9 % IV BOLUS (SEPSIS)
1000.0000 mL | Freq: Once | INTRAVENOUS | Status: AC
  Administered 2014-11-10: 1000 mL via INTRAVENOUS

## 2014-11-10 NOTE — ED Notes (Signed)
Pt states he was here Wednesday and dx with pneumonia and he is no better today.

## 2014-11-10 NOTE — Discharge Instructions (Signed)
Chest x-ray shows improvement in the pneumonia but not completely gone away. Stop smoking. New prescription for antibiotic, inhaler, pain medication. You'll need a repeat chest x-ray in 4-6 weeks to make sure the pneumonia is completely gone.

## 2014-11-10 NOTE — ED Notes (Signed)
Patient with no complaints at this time. Respirations even and unlabored. Skin warm/dry. Discharge instructions reviewed with patient at this time. Patient given opportunity to voice concerns/ask questions. IV removed per policy and band-aid applied to site. Patient discharged at this time and left Emergency Department with steady gait.  

## 2014-11-10 NOTE — ED Provider Notes (Signed)
CSN: [id number]     Arrival date & time 11/10/14  1450 History  This chart was scribed for Donnetta Hutching, MD by Luisa Dago, Medical Scribe. This patient was seen in room APA07/APA07 and the patient's care was started at 3:09 PM.    Chief Complaint  Patient presents with  . Pneumonia   HPI HPI Comments: [name] is a 44 y.o. male with PMhx of asthma and tobacco use (pt smokes about 1 pack/daily)  was diagnosed with pneumonia last week presents to the Emergency Department or persistent pneumonia symptoms. Pt was last seen here on 11/04/2014, about a week ago. CXR showed retrocardiac left lower lobe opacity which was consistent with community-acquired pneumonia. Pt was given IV fluids, IV Zithromax, and IV rocephin. He was d/c with Zithromax and percocet for pain. Today, he comes in with persisting left sided chest pain which is exacerbated by deep breathing and coughing. He states that he has cut down on his tobacco daily tobacco use. Pt denies fever, neck pain, sore throat, visual disturbance, CP, cough, SOB, abdominal pain, nausea, emesis, diarrhea, urinary symptoms, back pain, HA, weakness, numbness and rash as associated symptoms.     Past Medical History  Diagnosis Date  . Pneumonia   . Panic attack   . Asthma    Past Surgical History  Procedure Laterality Date  . Hernia repair     No family history on file. History  Substance Use Topics  . Smoking status: Current Every Day Smoker  . Smokeless tobacco: Not on file  . Alcohol Use: Yes     Comment: daily 2 or more beers.     Review of Systems  Respiratory: Positive for cough and shortness of breath.   Cardiovascular: Positive for chest pain.  A complete 10 system review of systems was obtained and all systems are negative except as noted in the HPI and PMH.   Allergies  Review of patient's allergies indicates no known allergies.  Home Medications   Prior to Admission medications   Medication Sig Start Date End Date  Taking? Authorizing Provider  citalopram (CELEXA) 40 MG tablet Take 40 mg by mouth daily.   Yes Historical Provider, MD  clonazePAM (KLONOPIN) 2 MG tablet Take 1 mg by mouth daily as needed. Anxiety/panic attacks   Yes Historical Provider, MD  vitamin B-12 (CYANOCOBALAMIN) 1000 MCG tablet Take 1,000 mcg by mouth daily.   Yes Historical Provider, MD  albuterol (PROVENTIL HFA;VENTOLIN HFA) 108 (90 BASE) MCG/ACT inhaler Inhale 1-2 puffs into the lungs every 6 (six) hours as needed for wheezing or shortness of breath. 11/10/14   Donnetta Hutching, MD  azithromycin (ZITHROMAX Z-PAK) 250 MG tablet 2 po day one, then 1 daily x 4 days Patient not taking: Reported on 11/10/2014 11/04/14   Donnetta Hutching, MD  levofloxacin (LEVAQUIN) 500 MG tablet Take 1 tablet (500 mg total) by mouth daily. 11/10/14   Donnetta Hutching, MD  oxyCODONE-acetaminophen (PERCOCET) 5-325 MG per tablet Take 2 tablets by mouth every 4 (four) hours as needed. 11/10/14   Donnetta Hutching, MD   BP 116/86 mmHg  Pulse 110  Temp(Src) 97.8 F (36.6 C) (Oral)  Resp 18  Ht  (1.702 m)  Wt 210 lb (95.255 kg)  BMI 32.88 kg/m2  SpO2 97%  Physical Exam  Constitutional: He is oriented to person, place, and time. He appears well-developed and well-nourished.  Coughing. Not obvious tachypnea.   HENT:  Head: Normocephalic and atraumatic.  Eyes: Conjunctivae and EOM are  normal. Pupils are equal, round, and reactive to light.  Neck: Normal range of motion. Neck supple.  Cardiovascular: Normal rate and regular rhythm.   Pulmonary/Chest: Effort normal and breath sounds normal.  Abdominal: Soft. Bowel sounds are normal.  Musculoskeletal: Normal range of motion.  Neurological: He is alert and oriented to person, place, and time.  Skin: Skin is warm and dry.  Psychiatric: He has a normal mood and affect. His behavior is normal.  Nursing note and vitals reviewed.   ED Course  Procedures (including critical care time)  DIAGNOSTIC STUDIES: Oxygen Saturation is  97% on RA, adequate by my interpretation.    COORDINATION OF CARE: 3:15 PM- CXR ordered. Pt advised of plan for treatment and pt agrees.  Labs Review Labs Reviewed  BASIC METABOLIC PANEL - Abnormal; Notable for the following:    Potassium 3.4 (*)    Glucose, Bld 157 (*)    All other components within normal limits  CBC WITH DIFFERENTIAL/PLATELET  TROPONIN I    Imaging Review Dg Chest 2 View  11/10/2014   CLINICAL DATA:  Chest pain for 1 week, history of pneumonia  EXAM: CHEST  2 VIEW  COMPARISON:  11/04/2014  FINDINGS: Cardiomediastinal silhouette is stable. Although there is improvement there is incomplete clearing of previous streaky left base retrocardiac infiltrate. Follow-up to complete resolution is recommended. Stable small calcified granuloma in right lower lobe laterally. There is no new infiltrate or pulmonary edema.  IMPRESSION: Although there is improvement there is incomplete clearing of previous streaky left base retrocardiac infiltrate. Follow-up to complete resolution is recommended. Stable small calcified granuloma in right lower lobe laterally. There is no new infiltrate or pulmonary edema.   Electronically Signed   By: Natasha MeadLiviu  Pop M.D.   On: 11/10/2014 16:44     EKG Interpretation None      MDM   Final diagnoses:  Community acquired pneumonia    Patient has persistent cough. Left-sided pneumonia diagnosed on 11/04/2014. Repeat cxr chest x-ray shows improvement, but there is incomplete clearing of the left basilar retrograde infiltrate. Patient is nontoxic-appearing. Afebrile. Well-hydrated. Will restart antibiotics and recommend repeat chest x-ray in 4-6 weeks to assess for resolution. Discharge medications Levaquin 500 milligrams, Percocet, albuterol inhaler.  I personally performed the services described in this documentation, which was scribed in my presence. The recorded information has been reviewed and is accurate.    Donnetta HutchingBrian Abdulah Iqbal, MD 11/10/14 [id number]

## 2015-11-08 ENCOUNTER — Emergency Department (HOSPITAL_COMMUNITY): Payer: Self-pay

## 2015-11-08 ENCOUNTER — Emergency Department (HOSPITAL_COMMUNITY)
Admission: EM | Admit: 2015-11-08 | Discharge: 2015-11-08 | Disposition: A | Discharge status: Home / Self Care | Payer: Self-pay | Attending: Emergency Medicine | Admitting: Emergency Medicine

## 2015-11-08 ENCOUNTER — Encounter (HOSPITAL_COMMUNITY): Payer: Self-pay

## 2015-11-08 DIAGNOSIS — Y999 Unspecified external cause status: Secondary | ICD-10-CM | POA: Insufficient documentation

## 2015-11-08 DIAGNOSIS — Y929 Unspecified place or not applicable: Secondary | ICD-10-CM | POA: Insufficient documentation

## 2015-11-08 DIAGNOSIS — F17200 Nicotine dependence, unspecified, uncomplicated: Secondary | ICD-10-CM | POA: Insufficient documentation

## 2015-11-08 DIAGNOSIS — S060X9A Concussion with loss of consciousness of unspecified duration, initial encounter: Secondary | ICD-10-CM

## 2015-11-08 DIAGNOSIS — Y939 Activity, unspecified: Secondary | ICD-10-CM | POA: Insufficient documentation

## 2015-11-08 DIAGNOSIS — Z79899 Other long term (current) drug therapy: Secondary | ICD-10-CM | POA: Insufficient documentation

## 2015-11-08 DIAGNOSIS — J45909 Unspecified asthma, uncomplicated: Secondary | ICD-10-CM | POA: Insufficient documentation

## 2015-11-08 MED ORDER — AMOXICILLIN-POT CLAVULANATE 875-125 MG PO TABS: 1.0000 | ORAL_TABLET | Freq: Two times a day (BID) | ORAL | Status: DC

## 2015-11-08 MED ORDER — ALBUTEROL SULFATE HFA 108 (90 BASE) MCG/ACT IN AERS
2.0000 | INHALATION_SPRAY | Freq: Once | RESPIRATORY_TRACT | Status: AC
  Administered 2015-11-08: 2 via RESPIRATORY_TRACT
  Filled 2015-11-08: qty 6.7

## 2015-11-08 NOTE — ED Notes (Signed)
Oozing blood from both nares, pt holding pressure.  C/o of nasal congestion.
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
## 2015-11-08 NOTE — Discharge Instructions (Signed)
CT scan of head and chest x-ray show no acute findings. There is still a possibility you have a nasal fracture. Follow-up with ear nose and throat doctor. Phone number given. Prescription for Augmentin. Nurse will give you an inhaler to help your breathing.
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
## 2018-04-22 ENCOUNTER — Emergency Department (HOSPITAL_COMMUNITY)
Admission: EM | Admit: 2018-04-22 | Discharge: 2018-04-22 | Disposition: A | Discharge status: Home / Self Care | Payer: Self-pay | Attending: Emergency Medicine | Admitting: Emergency Medicine

## 2018-04-22 ENCOUNTER — Encounter (HOSPITAL_COMMUNITY): Payer: Self-pay | Admitting: Emergency Medicine

## 2018-04-22 ENCOUNTER — Emergency Department (HOSPITAL_COMMUNITY): Payer: Self-pay

## 2018-04-22 ENCOUNTER — Other Ambulatory Visit: Payer: Self-pay

## 2018-04-22 DIAGNOSIS — Z79899 Other long term (current) drug therapy: Secondary | ICD-10-CM | POA: Insufficient documentation

## 2018-04-22 DIAGNOSIS — R079 Chest pain, unspecified: Secondary | ICD-10-CM | POA: Insufficient documentation

## 2018-04-22 DIAGNOSIS — J45909 Unspecified asthma, uncomplicated: Secondary | ICD-10-CM | POA: Insufficient documentation

## 2018-04-22 DIAGNOSIS — F17200 Nicotine dependence, unspecified, uncomplicated: Secondary | ICD-10-CM | POA: Insufficient documentation

## 2018-04-22 LAB — I-STAT TROPONIN, ED
TROPONIN I, POC: 0.01 ng/mL (ref 0.00–0.08)
TROPONIN I, POC: 0 ng/mL (ref 0.00–0.08)

## 2018-04-22 LAB — CBC
HCT: 48.1 % (ref 39.0–52.0)
Hemoglobin: 16.2 g/dL (ref 13.0–17.0)
MCH: 32.9 pg (ref 26.0–34.0)
MCHC: 33.7 g/dL (ref 30.0–36.0)
MCV: 97.6 fL (ref 78.0–100.0)
PLATELETS: 193 10*3/uL (ref 150–400)
RBC: 4.93 MIL/uL (ref 4.22–5.81)
RDW: 12.8 % (ref 11.5–15.5)
WBC: 11.1 10*3/uL — AB (ref 4.0–10.5)

## 2018-04-22 LAB — BASIC METABOLIC PANEL
Anion gap: 10 (ref 5–15)
BUN: 10 mg/dL (ref 6–20)
CALCIUM: 9 mg/dL (ref 8.9–10.3)
CO2: 27 mmol/L (ref 22–32)
Chloride: 102 mmol/L (ref 98–111)
Creatinine, Ser: 0.84 mg/dL (ref 0.61–1.24)
GFR calc Af Amer: >60 mL/min (ref >60)
Glucose, Bld: 146 mg/dL — ABNORMAL HIGH (ref 70–99)
POTASSIUM: 3.6 mmol/L (ref 3.5–5.1)
SODIUM: 139 mmol/L (ref 135–145)

## 2018-04-22 MED ORDER — FENTANYL CITRATE (PF) 100 MCG/2ML IJ SOLN
50.0000 ug | INTRAMUSCULAR | Status: DC | PRN
  Administered 2018-04-22: 50 ug via INTRAVENOUS
  Filled 2018-04-22: qty 2

## 2018-04-22 MED ORDER — IOPAMIDOL (ISOVUE-370) INJECTION 76%
100.0000 mL | Freq: Once | INTRAVENOUS | Status: AC | PRN
  Administered 2018-04-22: 100 mL via INTRAVENOUS

## 2018-04-22 MED ORDER — ASPIRIN 81 MG PO CHEW
324.0000 mg | CHEWABLE_TABLET | Freq: Once | ORAL | Status: AC
  Administered 2018-04-22: 324 mg via ORAL
  Filled 2018-04-22: qty 4

## 2018-04-22 NOTE — ED Notes (Signed)
Cardiology called with appt for Fri. Sept. 6 at 1:30pm with Dr. Diona Browner  In the [place] office.  Dr. Jodi Mourning informed.

## 2018-04-22 NOTE — ED Triage Notes (Signed)
Patient complains of left sided chest pain and tingling with shortness of breath and dizziness. States history of panic attacks. States nausea. Denies vomiting.

## 2018-04-22 NOTE — Discharge Instructions (Addendum)
Take aspirin daily until cleared by heart doctor on Friday appointment.  Return to ER for recurrent chest pain or shortness of breath.

## 2018-04-22 NOTE — ED Provider Notes (Signed)
[facility] EMERGENCY DEPARTMENT Provider Note   CSN: [id number] Arrival date & time: 04/22/18  4098     History   Chief Complaint Chief Complaint  Patient presents with  . Chest Pain  . Dizziness    HPI [name] is a 47 y.o. male.  Patient with history of cocaine abuse currently not using, cigarette smoking, obesity, family history of cardiac presents with acute onset chest pain and mild diaphoresis that started around 730- 8:00 this morning  Patient states this is different than previous.  Patient denies any known cardiac issues.  Patient denies blood clot risk factors.  Patient does not have formal diagnosis of high blood pressure.  No fevers or chills.  No significant radiation anterior chest discomfort/ache..     Past Medical History:  Diagnosis Date  . Asthma   . Panic attack   . Pneumonia     There are no active problems to display for this patient.   Past Surgical History:  Procedure Laterality Date  . HERNIA REPAIR          Home Medications    Prior to Admission medications   Medication Sig Start Date End Date Taking? Authorizing Provider  clonazePAM (KLONOPIN) 2 MG tablet Take 1-2 mg by mouth daily as needed for anxiety. Anxiety/panic attacks   Yes [provider]  escitalopram (LEXAPRO) 20 MG tablet Take 1 tablet by mouth daily. 03/29/18  Yes [provider]  Multiple Vitamins-Minerals (MULTIVITAMIN WITH MINERALS) tablet Take 1 tablet by mouth daily.   Yes [provider]  vitamin B-12 (CYANOCOBALAMIN) 1000 MCG tablet Take 1,000 mcg by mouth daily.   Yes [provider]  albuterol (PROVENTIL HFA;VENTOLIN HFA) 108 (90 BASE) MCG/ACT inhaler Inhale 1-2 puffs into the lungs every 6 (six) hours as needed for wheezing or shortness of breath. Patient not taking: Reported on 04/22/2018 11/10/14   Donnetta Hutching, MD  amoxicillin-clavulanate (AUGMENTIN) 875-125 MG tablet Take 1 tablet by mouth every 12 (twelve) hours. Patient  not taking: Reported on 04/22/2018 11/08/15   Donnetta Hutching, MD    Family History No family history on file.  Social History Social History   Tobacco Use  . Smoking status: Current Every Day Smoker  . Smokeless tobacco: Never Used  Substance Use Topics  . Alcohol use: Yes    Comment: daily 2 or more beers but says has cut back over the past week or so.  . Drug use: No     Allergies   Patient has no known allergies.   Review of Systems Review of Systems  Constitutional: Positive for appetite change. Negative for chills and fever.  HENT: Negative for congestion.   Eyes: Negative for visual disturbance.  Respiratory: Positive for shortness of breath.   Cardiovascular: Positive for chest pain.  Gastrointestinal: Negative for abdominal pain and vomiting.  Genitourinary: Negative for dysuria and flank pain.  Musculoskeletal: Negative for back pain, neck pain and neck stiffness.  Skin: Negative for rash.  Neurological: Positive for light-headedness. Negative for headaches.     Physical Exam Updated Vital Signs BP (!) 138/97   Pulse 84   Temp 98 F (36.7 C) (Oral)   Resp (!) 21   Ht 5\' 10"  (1.778 m)   Wt 99.8 kg   SpO2 95%   BMI 31.57 kg/m   Physical Exam  Constitutional: He is oriented to person, place, and time. He appears well-developed and well-nourished.  HENT:  Head: Normocephalic and atraumatic.  Eyes: Conjunctivae are normal.  Right eye exhibits no discharge. Left eye exhibits no discharge.  Neck: Normal range of motion. Neck supple. No tracheal deviation present.  Cardiovascular: Normal rate, regular rhythm and normal pulses.  Pulmonary/Chest: Effort normal and breath sounds normal.  Abdominal: Soft. He exhibits no distension. There is no tenderness. There is no guarding.  Musculoskeletal: He exhibits no edema.       Right lower leg: He exhibits no edema.       Left lower leg: He exhibits no edema.  Neurological: He is alert and oriented to person, place, and  time.  Skin: Skin is warm. No rash noted.  Psychiatric: He has a normal mood and affect.  Nursing note and vitals reviewed.    ED Treatments / Results  Labs (all labs ordered are listed, but only abnormal results are displayed) Labs Reviewed  BASIC METABOLIC PANEL - Abnormal; Notable for the following components:      Result Value   Glucose, Bld 146 (*)    All other components within normal limits  CBC - Abnormal; Notable for the following components:   WBC 11.1 (*)    All other components within normal limits  I-STAT TROPONIN, ED  I-STAT TROPONIN, ED    EKG EKG Interpretation  Date/Time:  Tuesday April 22 2018 08:26:16 EDT Ventricular Rate:  83 PR Interval:    QRS Duration: 114 QT Interval:  424 QTC Calculation: 499 R Axis:   1 Text Interpretation:  Sinus rhythm Borderline intraventricular conduction delay RSR' in V1 or V2, right VCD or RVH Borderline prolonged QT interval Similar/ improved to previous Confirmed by Blane Ohara [phone]) on 04/22/2018 8:47:36 AM   Radiology Dg Chest 2 View  Result Date: 04/22/2018 CLINICAL DATA:  Left-sided chest pain. EXAM: CHEST - 2 VIEW COMPARISON:  11/08/2015. FINDINGS: EKG leads noted over the chest. Mediastinum hilar structures normal. Heart size stable. No focal infiltrate. Tiny calcified pulmonary nodule right base consistent calcified granuloma. This is stable. No pleural effusion or pneumothorax. No acute bony abnormality. IMPRESSION: No acute cardiopulmonary disease. Electronically Signed   By: Maisie Fus  Register   On: 04/22/2018 08:55   Ct Angio Chest/abd/pel For Dissection W And/or Wo Contrast  Result Date: 04/22/2018 CLINICAL DATA:  Patient complains of left sided chest pain and tingling with shortness of breath and dizziness since 7:30am. States history of panic attacks. States nausea. Denies vomiting. No chest or abdominal surgeries EXAM: CT ANGIOGRAPHY CHEST, ABDOMEN AND PELVIS TECHNIQUE: Multidetector CT imaging through the  chest, abdomen and pelvis was performed using the standard protocol during bolus administration of intravenous contrast. Multiplanar reconstructed images and MIPs were obtained and reviewed to evaluate the vascular anatomy. CONTRAST:  ISOVUE-370 IOPAMIDOL (ISOVUE-370) INJECTION 76% COMPARISON:  02/25/2010 FINDINGS: CTA CHEST FINDINGS Cardiovascular: Heart size upper limits normal. No pericardial effusion. There is fairly good contrast opacification of pulmonary artery branches without definite filling defect to suggest acute PE. Adequate contrast opacification of the thoracic aorta with no evidence of dissection, aneurysm, or stenosis. There is classic 3-vessel brachiocephalic arch anatomy without proximal stenosis. No significant atheromatous plaque. Mediastinum/Nodes: No enlarged mediastinal, hilar, or axillary lymph nodes. Calcified right hilar lymph nodes consistent with old granulomatous disease. Thyroid gland, trachea, and esophagus demonstrate no significant findings. Lungs/Pleura: No pleural effusion. No pneumothorax. Emphysematous changes most marked in the apices. Calcified granuloma in the lateral basal segment right lower lobe. Lungs otherwise clear. Musculoskeletal: No chest wall abnormality. No acute or significant osseous findings. Review of the MIP images confirms the above  findings. CTA ABDOMEN AND PELVIS FINDINGS VASCULAR Aorta: Minimal scattered calcified plaque. No aneurysm, dissection, or stenosis. Celiac: Patent without evidence of aneurysm, dissection, vasculitis or significant stenosis. SMA: Patent without evidence of aneurysm, dissection, vasculitis or significant stenosis. Renals: Single right, widely patent. Duplicated left, superior dominant, both patent IMA: Patent without evidence of aneurysm, dissection, vasculitis or significant stenosis. Inflow: Minimal calcified plaque at the origin of the common iliac arteries, and in bilateral internal iliac and common femoral arteries. Mild  tortuosity. No aneurysm, dissection, or stenosis. Veins: No obvious venous abnormality within the limitations of this arterial phase study. Review of the MIP images confirms the above findings. NON-VASCULAR Hepatobiliary: Calcified granuloma in hepatic segment 8. No other liver lesion. Normal gallbladder. No biliary ductal dilatation. Pancreas: Unremarkable. No pancreatic ductal dilatation or surrounding inflammatory changes. Spleen: Normal in size without focal abnormality. Adrenals/Urinary Tract: Adrenal glands are unremarkable. Kidneys are normal, without renal calculi, focal lesion, or hydronephrosis. Bladder is unremarkable. Stomach/Bowel: Stomach is decompressed. Small bowel nondilated. Appendix not discretely identified. No pericecal inflammatory/edematous change. Colon is nondilated, unremarkable. Lymphatic: No abdominal or pelvic adenopathy. Reproductive: Mild prostatic prominence.  Left pelvic phleboliths. Other: No ascites.  No free air. Musculoskeletal: No acute or significant osseous findings. Right inguinal hernia containing only fat. Review of the MIP images confirms the above findings. IMPRESSION: 1. Negative for aortic dissection or other acute finding. 2. Pulmonary emphysema 3. Aortoiliac atherosclerosis (ICD10-170.0) without significant stenosis. 4. Old granulomatous disease. Electronically Signed   By: Corlis Leak M.D.   On: 04/22/2018 10:17    Procedures Procedures (including critical care time)  Medications Ordered in ED Medications  fentaNYL (SUBLIMAZE) injection 50 mcg (50 mcg Intravenous Given 04/22/18 0933)  iopamidol (ISOVUE-370) 76 % injection 100 mL (100 mLs Intravenous Contrast Given 04/22/18 0949)  aspirin chewable tablet 324 mg (324 mg Oral Given 04/22/18 1217)     Initial Impression / Assessment and Plan / ED Course  I have reviewed the triage vital signs and the nursing notes.  Pertinent labs & imaging results that were available during my care of the patient were reviewed  by me and considered in my medical decision making (see chart for details).    Patient with 3 risk factors for coronary artery disease presents with sudden onset chest pain and high blood pressure.  With cocaine use, smoking and likely undiagnosed high blood pressure CT scan of the chest ordered and no signs of significant pulmonary embolism/dissection.  Patient chest pains improved in the ER with pain meds.  Aspirin ordered.  Patient has a hear score of 4 and plan to discuss with cardiology for either observation versus close outpatient follow-up for possible stress test.  Discussed with Dr. Diona Browner agrees that patient will need to be seen by cardiology for likely stress test.  He was able to arrange an appointment for this Friday afternoon.  Patient has 2- troponins, chest pain-free.  Plan for aspirin until further evaluation on Friday.  Results and differential diagnosis were discussed with the patient/parent/guardian. Xrays were independently reviewed by myself.  Close follow up outpatient was discussed, comfortable with the plan.   Medications  fentaNYL (SUBLIMAZE) injection 50 mcg (50 mcg Intravenous Given 04/22/18 0933)  iopamidol (ISOVUE-370) 76 % injection 100 mL (100 mLs Intravenous Contrast Given 04/22/18 0949)  aspirin chewable tablet 324 mg (324 mg Oral Given 04/22/18 1217)    Vitals:   04/22/18 1030 04/22/18 1100 04/22/18 1130 04/22/18 1202  BP: (!) 144/102 (!) 146/96 (!) 170/106 Marland Kitchen)  138/97  Pulse: 71 70 76 84  Resp: 19 19 18  (!) 21  Temp:      TempSrc:      SpO2: (!) 89% (!) 87% 91% 95%  Weight:      Height:        Final diagnoses:  Acute chest pain     Final Clinical Impressions(s) / ED Diagnoses   Final diagnoses:  Acute chest pain    ED Discharge Orders    None       Blane Ohara, MD 04/22/18 1324

## 2018-04-24 ENCOUNTER — Encounter: Payer: Self-pay | Admitting: Cardiology

## 2018-04-24 NOTE — Progress Notes (Signed)
Cardiology Office Note  Date: 04/25/2018   ID: AMONTAE Guerra, DOB 1971/05/31, MRN [id number]  PCP: Assunta Found, MD  Consulting cardiologist: Nona Dell, MD   Chief Complaint  Patient presents with  . Chest Pain    History of Present Illness: [name] is a 47 y.o. male referred for cardiology consultation by Dr. Jodi Mourning after recent ER visit at [facility] for evaluation of chest pain.  He tells me that he has had dyspnea on exertion and fatigue for at least a few months now.  He does not report orthopnea or PND, although he states that other people that have observed him sleeping have told him that he probably has sleep apnea.  He was seen in the ER describing left-sided chest discomfort, both "sharp and tight" that began while he was having a normal conversation with his boss at work.  He also felt lightheaded and nauseated.  Work-up in the ER was negative for ACS in terms of troponin I levels, and his ECG showed no acute ST segment changes.  He also underwent a CT of the chest that was negative for dissection.  He did have aortic atherosclerosis and also emphysematous changes.  Old records reveal previous cardiology evaluation by Dr. Dorethea Clan back in 2004 for evaluation of chest pain.  He does have a history of cocaine induced chest pain based on these records.  Cardiolite was negative for ischemia at that time.  Patient denies any active substance abuse at this point.  Long-standing history of tobacco abuse, we discussed smoking cessation today.  He states that he has tried before but has never been able to quit longer than a week.  He reports no new medications, I reviewed his list below.  Blood pressure is elevated today, he reports no known history of hypertension.  Family history includes coronary artery disease in his father, also congenital heart disease in his brother.  Past Medical History:  Diagnosis Date  . Asthma   . History of pneumonia   . History of substance  abuse    Cocaine  . Panic attacks     Past Surgical History:  Procedure Laterality Date  . HERNIA REPAIR      Current Outpatient Medications  Medication Sig Dispense Refill  . albuterol (PROVENTIL HFA;VENTOLIN HFA) 108 (90 BASE) MCG/ACT inhaler Inhale 1-2 puffs into the lungs every 6 (six) hours as needed for wheezing or shortness of breath. 1 Inhaler 0  . clonazePAM (KLONOPIN) 2 MG tablet Take 1-2 mg by mouth daily as needed for anxiety. Anxiety/panic attacks    . escitalopram (LEXAPRO) 20 MG tablet Take 1 tablet by mouth daily.  3  . Multiple Vitamins-Minerals (MULTIVITAMIN WITH MINERALS) tablet Take 1 tablet by mouth daily.    . vitamin B-12 (CYANOCOBALAMIN) 1000 MCG tablet Take 1,000 mcg by mouth daily.     No current facility-administered medications for this visit.    Allergies:  Patient has no known allergies.   Social History: The patient  reports that he has been smoking cigarettes. He started smoking about 34 years ago. He has been smoking about 2.00 packs per day. He has never used smokeless tobacco. He reports that he drinks alcohol. He reports that he does not use drugs.   Family History: The patient's family history includes Congenital heart disease in his brother; Coronary artery disease in his father; Heart disease in his maternal grandmother.   ROS:  Please see the history of present illness. Otherwise, complete review  of systems is positive for intermittent arthritic pains.  All other systems are reviewed and negative.   Physical Exam: VS:  BP (!) 140/100 (BP Location: Right Arm, Cuff Size: Normal)   Pulse 83   Ht 5\' 10"  (1.778 m)   Wt 221 lb 9.6 oz (100.5 kg)   SpO2 94%   BMI 31.80 kg/m , BMI Body mass index is 31.8 kg/m.  Wt Readings from Last 3 Encounters:  04/25/18 221 lb 9.6 oz (100.5 kg)  04/22/18 220 lb (99.8 kg)  11/08/15 220 lb (99.8 kg)    General: Patient appears comfortable at rest. HEENT: Conjunctiva and lids normal, oropharynx clear. Neck:  Supple, no elevated JVP or carotid bruits, no thyromegaly. Lungs: No wheezing, nonlabored breathing at rest. Cardiac: Regular rate and rhythm, no S3 or significant systolic murmur, no pericardial rub. Abdomen: Soft, nontender, bowel sounds present, no guarding or rebound. Extremities: No pitting edema, distal pulses 2+. Skin: Warm and dry.  Tattoos. Musculoskeletal: No kyphosis. Neuropsychiatric: Alert and oriented x3, affect grossly appropriate.  ECG: I personally reviewed the tracing from 04/22/2018 which showed sinus rhythm with small R' in lead V1 and V2.  Recent Labwork: 04/22/2018: BUN 10; Creatinine, Ser 0.84; Hemoglobin 16.2; Platelets 193; Potassium 3.6; Sodium 139   Other Studies Reviewed Today:  Echocardiogram 11/24/2008: SUMMARY - Overall left ventricular systolic function was normal. Left    ventricular ejection fraction was estimated to be 60 %. There    were no left ventricular regional wall motion abnormalities. - There was mild mitral annular calcification. - Left atrial size was at the upper limits of normal.  Cardiolite 09/30/2002: FINDINGS CLINICAL DATA:  CHEST PAIN. MYOCARDIAL PERFUSION MULTIPLE W/SPECT/EJECTION FRACTION/WALL MOTION THE PATIENT UNDERWENT PHARMACOLOGIC STRESS TEST WITH IV ADENOSINE.  AT PEAK PHARMACOLOGIC EFFECT, 30 MCI TC-66M SESTAMIBI WAS INJECTED INTRAVENOUSLY FOR MYOCARDIAL PERFUSION IMAGING.  RESTING EXAM PERFORMED PRIOR TO PHARMACOLOGIC STRESS USING 10 MCI TC-66M SESTAMIBI. MYOCARDIAL PERFUSION SPECT IMAGES OBTAINED AFTER IV ADENOSINE DEMONSTRATE MILD APICAL THINNING.  NO OTHER PERFUSION ABNORMALITIES ARE SEEN.  NO PULMONARY UPTAKE OF TRACER.  RESTING EXAM UNCHANGED. NORMAL LEFT VENTRICULAR EJECTION FRACTION OF 61% AS CALCULATED FROM THE GATED SPECT IMAGES AFTER IV ADENOSINE.  THIS IS DERIVED FROM END DIASTOLIC VOLUME CALCULATION OF 140 ML AND END SYSTOLIC VOLUME OF 54 ML. NORMAL WALL MOTION. IMPRESSION MILD APICAL THINNING;  OTHERWISE, NORMAL EXAM.  CT imaging 04/22/2018: FINDINGS: CTA CHEST FINDINGS  Cardiovascular: Heart size upper limits normal. No pericardial effusion. There is fairly good contrast opacification of pulmonary artery branches without definite filling defect to suggest acute PE. Adequate contrast opacification of the thoracic aorta with no evidence of dissection, aneurysm, or stenosis. There is classic 3-vessel brachiocephalic arch anatomy without proximal stenosis. No significant atheromatous plaque.  Mediastinum/Nodes: No enlarged mediastinal, hilar, or axillary lymph nodes. Calcified right hilar lymph nodes consistent with old granulomatous disease. Thyroid gland, trachea, and esophagus demonstrate no significant findings.  Lungs/Pleura: No pleural effusion. No pneumothorax. Emphysematous changes most marked in the apices. Calcified granuloma in the lateral basal segment right lower lobe. Lungs otherwise clear.  Musculoskeletal: No chest wall abnormality. No acute or significant osseous findings.  Review of the MIP images confirms the above findings.  CTA ABDOMEN AND PELVIS FINDINGS  VASCULAR  Aorta: Minimal scattered calcified plaque. No aneurysm, dissection, or stenosis.  Celiac: Patent without evidence of aneurysm, dissection, vasculitis or significant stenosis.  SMA: Patent without evidence of aneurysm, dissection, vasculitis or significant stenosis.  Renals: Single right, widely patent. Duplicated left,  superior dominant, both patent  IMA: Patent without evidence of aneurysm, dissection, vasculitis or significant stenosis.  Inflow: Minimal calcified plaque at the origin of the common iliac arteries, and in bilateral internal iliac and common femoral arteries. Mild tortuosity. No aneurysm, dissection, or stenosis.  Veins: No obvious venous abnormality within the limitations of this arterial phase study.  Review of the MIP images confirms the  above findings.  NON-VASCULAR  Hepatobiliary: Calcified granuloma in hepatic segment 8. No other liver lesion. Normal gallbladder. No biliary ductal dilatation.  Pancreas: Unremarkable. No pancreatic ductal dilatation or surrounding inflammatory changes.  Spleen: Normal in size without focal abnormality.  Adrenals/Urinary Tract: Adrenal glands are unremarkable. Kidneys are normal, without renal calculi, focal lesion, or hydronephrosis. Bladder is unremarkable.  Stomach/Bowel: Stomach is decompressed. Small bowel nondilated. Appendix not discretely identified. No pericecal inflammatory/edematous change. Colon is nondilated, unremarkable.  Lymphatic: No abdominal or pelvic adenopathy.  Reproductive: Mild prostatic prominence.  Left pelvic phleboliths.  Other: No ascites.  No free air.  Musculoskeletal: No acute or significant osseous findings. Right inguinal hernia containing only fat.  Review of the MIP images confirms the above findings.  IMPRESSION: 1. Negative for aortic dissection or other acute finding. 2. Pulmonary emphysema 3. Aortoiliac atherosclerosis (ICD10-170.0) without significant stenosis. 4. Old granulomatous disease.  Assessment and Plan:  1.  Dyspnea on exertion and recent chest discomfort.  Troponin I levels were negative and eat recent ER visit and ECG without acute changes.  No aortic dissection by chest CT with incidental findings of aortic atherosclerosis and pulmonary emphysema.  He has a long-standing history of tobacco abuse, blood pressure elevated today but not known history of hypertension, also family history of heart disease.  Last ischemic evaluation was in 2004.  Plan at this time is to arrange an echocardiogram as well as a Lexiscan Myoview for further objective evaluation.  2.  Long-standing tobacco abuse.  We discussed smoking cessation.  3.  Possible obstructive sleep apnea based on discussion today.  I have asked him to talk  with his PCP about referral for sleep testing.  4.  Elevated blood pressure, possible essential hypertension.  I have asked him to follow-up with his PCP.  Current medicines were reviewed with the patient today.   Orders Placed This Encounter  Procedures  . NM Myocar Multi W/Spect W/Wall Motion / EF  . ECHOCARDIOGRAM COMPLETE    Disposition: Call with test results.  Signed, Jonelle Sidle, MD, [facility] 04/25/2018 1:36 PM    [facility] at [facility] [address] [place], [place], [place] [address] Phone: [phone]; Fax: [phone]

## 2018-04-25 ENCOUNTER — Encounter: Payer: Self-pay | Admitting: *Deleted

## 2018-04-25 ENCOUNTER — Encounter: Payer: Self-pay | Admitting: Cardiology

## 2018-04-25 ENCOUNTER — Ambulatory Visit (INDEPENDENT_AMBULATORY_CARE_PROVIDER_SITE_OTHER): Payer: Self-pay | Admitting: Cardiology

## 2018-04-25 VITALS — BP 140/100 | HR 83 | Ht 70.0 in | Wt 221.6 lb

## 2018-04-25 DIAGNOSIS — Z720 Tobacco use: Secondary | ICD-10-CM

## 2018-04-25 DIAGNOSIS — R072 Precordial pain: Secondary | ICD-10-CM

## 2018-04-25 DIAGNOSIS — R0602 Shortness of breath: Secondary | ICD-10-CM

## 2018-04-25 DIAGNOSIS — R030 Elevated blood-pressure reading, without diagnosis of hypertension: Secondary | ICD-10-CM

## 2018-04-25 DIAGNOSIS — Z8249 Family history of ischemic heart disease and other diseases of the circulatory system: Secondary | ICD-10-CM

## 2018-04-25 NOTE — Patient Instructions (Signed)
Medication Instructions:   Your physician recommends that you continue on your current medications as directed. Please refer to the Current Medication list given to you today.  Labwork:  NONE  Testing/Procedures: Your physician has requested that you have a lexiscan myoview. For further information please visit [email or web address] Please follow instruction sheet, as given. Your physician has requested that you have an echocardiogram. Echocardiography is a painless test that uses sound waves to create images of your heart. It provides your doctor with information about the size and shape of your heart and how well your heart's chambers and valves are working. This procedure takes approximately one hour. There are no restrictions for this procedure.  Follow-Up:  Your physician recommends that you schedule a follow-up appointment in: pending test results. We will call you with your results.  Any Other Special Instructions Will Be Listed Below (If Applicable).  If you need a refill on your cardiac medications before your next appointment, please call your pharmacy.

## 2018-05-06 ENCOUNTER — Other Ambulatory Visit (HOSPITAL_COMMUNITY): Payer: Self-pay

## 2018-05-06 ENCOUNTER — Encounter (HOSPITAL_COMMUNITY): Payer: Self-pay

## 2019-08-11 ENCOUNTER — Ambulatory Visit (INDEPENDENT_AMBULATORY_CARE_PROVIDER_SITE_OTHER): Payer: Self-pay

## 2019-08-11 ENCOUNTER — Ambulatory Visit
Admission: AD | Admit: 2019-08-11 | Discharge: 2019-08-11 | Disposition: A | Discharge status: Home / Self Care | Payer: Self-pay | Source: Ambulatory Visit | Attending: Emergency Medicine | Admitting: Emergency Medicine

## 2019-08-11 DIAGNOSIS — W19XXXA Unspecified fall, initial encounter: Secondary | ICD-10-CM

## 2019-08-11 DIAGNOSIS — R0789 Other chest pain: Secondary | ICD-10-CM

## 2019-08-11 DIAGNOSIS — R0781 Pleurodynia: Secondary | ICD-10-CM

## 2019-08-11 MED ORDER — NAPROXEN 375 MG PO TABS: 375.0000 mg | ORAL_TABLET | Freq: Two times a day (BID) | ORAL | 0 refills | Status: DC

## 2019-08-11 NOTE — Discharge Instructions (Addendum)
Chest x-ray negative for fracture.  Calcified granuloma present.  Present on 04/22/18 x-ray as well.   Make an effort to take deep breaths throughout the day to avoid partial lung collapse Continue conservative management of rest, ice, and gentle stretches/ massage Take naproxen as needed for pain relief (may cause abdominal discomfort, ulcers, and GI bleeds avoid taking with other NSAIDs or lexapro) Follow up with PCP if symptoms persist Return or go to the ER if you have any new or worsening symptoms (fever, chills, chest pain, shortness of breath, difficulty breathing, chest tightness, abdominal pain, symptoms do not improve with medication, etc...)

## 2019-08-11 NOTE — ED Triage Notes (Signed)
Pt had fall 3 days ago has left rib pain

## 2019-08-11 NOTE — ED Provider Notes (Signed)
[place]   [id number] 08/11/19 Arrival Time: 4765  CC: Left sided rib pain  SUBJECTIVE: History from: patient. [name] is a 48 y.o. male complains of left sided rib pain that began 4 days ago.  Symptoms began after a trip and fell onto left side.  Localizes the pain to the left outer lower ribs.  Describes the pain as constant and sharp in character.  Has tried OTC medications without relief.  Symptoms are made worse with deep breath.  Denies similar symptoms in the past.  Denies fever, chills, chest pain, SOB, dyspnea, erythema, ecchymosis, effusion, weakness, numbness and tingling.  ROS: As per HPI.  All other pertinent ROS negative.     Past Medical History:  Diagnosis Date  . Asthma   . History of pneumonia   . History of substance abuse ([place])    Cocaine  . Panic attacks    Past Surgical History:  Procedure Laterality Date  . HERNIA REPAIR     No Known Allergies No current facility-administered medications on file prior to encounter.   Current Outpatient Medications on File Prior to Encounter  Medication Sig Dispense Refill  . albuterol (PROVENTIL HFA;VENTOLIN HFA) 108 (90 BASE) MCG/ACT inhaler Inhale 1-2 puffs into the lungs every 6 (six) hours as needed for wheezing or shortness of breath. 1 Inhaler 0  . clonazePAM (KLONOPIN) 2 MG tablet Take 1-2 mg by mouth daily as needed for anxiety. Anxiety/panic attacks    . escitalopram (LEXAPRO) 20 MG tablet Take 1 tablet by mouth daily.  3  . Multiple Vitamins-Minerals (MULTIVITAMIN WITH MINERALS) tablet Take 1 tablet by mouth daily.    . vitamin B-12 (CYANOCOBALAMIN) 1000 MCG tablet Take 1,000 mcg by mouth daily.     Social History   Socioeconomic History  . Marital status: Single    Spouse name: Not on file  . Number of children: Not on file  . Years of education: Not on file  . Highest education level: Not on file  Occupational History  . Not on file  Tobacco Use  . Smoking status: Current Every Day  Smoker    Packs/day: 2.00    Types: Cigarettes    Start date: 06/18/1983  . Smokeless tobacco: Never Used  Substance and Sexual Activity  . Alcohol use: Yes    Comment: daily 2 or more beers but says has cut back over the past week or so.  . Drug use: No  . Sexual activity: Not on file  Other Topics Concern  . Not on file  Social History Narrative  . Not on file   Social Determinants of Health   Financial Resource Strain:   . Difficulty of Paying Living Expenses: Not on file  Food Insecurity:   . Worried About Charity fundraiser in the Last Year: Not on file  . Ran Out of Food in the Last Year: Not on file  Transportation Needs:   . Lack of Transportation (Medical): Not on file  . Lack of Transportation (Non-Medical): Not on file  Physical Activity:   . Days of Exercise per Week: Not on file  . Minutes of Exercise per Session: Not on file  Stress:   . Feeling of Stress : Not on file  Social Connections:   . Frequency of Communication with Friends and Family: Not on file  . Frequency of Social Gatherings with Friends and Family: Not on file  . Attends Religious Services: Not on file  . Active Member of  Clubs or Organizations: Not on file  . Attends Banker Meetings: Not on file  . Marital Status: Not on file  Intimate Partner Violence:   . Fear of Current or Ex-Partner: Not on file  . Emotionally Abused: Not on file  . Physically Abused: Not on file  . Sexually Abused: Not on file   Family History  Problem Relation Age of Onset  . Coronary artery disease Father   . Congenital heart disease Brother        Tetralogy of Fallot  . Heart disease Maternal Grandmother     OBJECTIVE:  Vitals:   08/11/19 1449  BP: (!) 158/104  Pulse: 86  Resp: 17  Temp: 98 F (36.7 C)  TempSrc: Oral  SpO2: 95%    General appearance: ALERT; in no acute distress.  Head: NCAT Lungs: Normal respiratory effort; CTAB CV: RRR Musculoskeletal: Rib cage Inspection: Skin  warm, dry, clear and intact without obvious erythema, effusion, or ecchymosis.  Palpation: TTP over LT Lateral ribs; TTP with lateral compression of chest Skin: warm and dry Neurologic: Ambulates without difficulty; Sensation intact about the upper extremities Psychological: alert and cooperative; normal mood and affect  DIAGNOSTIC STUDIES:  DG Ribs Unilateral W/Chest Left  Result Date: 08/11/2019 CLINICAL DATA:  Pain following fall EXAM: LEFT RIBS AND CHEST - 3+ VIEW COMPARISON:  Chest radiograph April 22, 2018 FINDINGS: Frontal chest as well as oblique and cone-down rib images were obtained. There is no edema or consolidation. There is a small granuloma in the right base. Heart size and pulmonary vascularity are normal. No adenopathy. There is no demonstrable rib fracture. No pneumothorax or pleural effusion. IMPRESSION: No evident rib fracture. Lungs clear except for calcified granuloma right base. No pneumothorax. Electronically Signed   By: Bretta Bang III M.D.   On: 08/11/2019 15:12     X-rays negative for bony abnormalities including fracture, or dislocation.   I have reviewed the x-rays myself and the radiologist interpretation. I am in agreement with the radiologist interpretation.     ASSESSMENT & PLAN:  1. Rib pain on left side   2. Fall, initial encounter     Meds ordered this encounter  Medications  . naproxen (NAPROSYN) 375 MG tablet    Sig: Take 1 tablet (375 mg total) by mouth 2 (two) times daily.    Dispense:  20 tablet    Refill:  0    Order Specific Question:   Supervising Provider    Answer:   Eustace Moore [[id number]]   Chest x-ray negative for fracture.  Calcified granuloma present.  Present on 04/22/18 x-ray as well.   Make an effort to take deep breaths throughout the day to avoid partial lung collapse Continue conservative management of rest, ice, and gentle stretches/ massage Take naproxen as needed for pain relief (may cause abdominal  discomfort, ulcers, and GI bleeds avoid taking with other NSAIDs) Follow up with PCP if symptoms persist Return or go to the ER if you have any new or worsening symptoms (fever, chills, chest pain, shortness of breath, difficulty breathing, chest tightness, abdominal pain, symptoms do not improve with medication, etc...)   Reviewed expectations re: course of current medical issues. Questions answered. Outlined signs and symptoms indicating need for more acute intervention. Patient verbalized understanding. After Visit Summary given.    Rennis Harding, PA-C 08/11/19 1538

## 2019-08-23 ENCOUNTER — Other Ambulatory Visit: Payer: Self-pay

## 2019-08-23 ENCOUNTER — Emergency Department (HOSPITAL_COMMUNITY)
Admission: EM | Admit: 2019-08-23 | Discharge: 2019-08-23 | Disposition: A | Discharge status: Home / Self Care | Payer: HRSA Program | Attending: Emergency Medicine | Admitting: Emergency Medicine

## 2019-08-23 ENCOUNTER — Ambulatory Visit
Admission: EM | Admit: 2019-08-23 | Discharge: 2019-08-23 | Disposition: A | Discharge status: Home / Self Care | Payer: Self-pay | Attending: Emergency Medicine | Admitting: Emergency Medicine

## 2019-08-23 ENCOUNTER — Encounter (HOSPITAL_COMMUNITY): Payer: Self-pay

## 2019-08-23 ENCOUNTER — Emergency Department (HOSPITAL_COMMUNITY): Payer: HRSA Program

## 2019-08-23 DIAGNOSIS — R0789 Other chest pain: Secondary | ICD-10-CM | POA: Diagnosis not present

## 2019-08-23 DIAGNOSIS — Z79899 Other long term (current) drug therapy: Secondary | ICD-10-CM | POA: Insufficient documentation

## 2019-08-23 DIAGNOSIS — Z20822 Contact with and (suspected) exposure to covid-19: Secondary | ICD-10-CM | POA: Diagnosis not present

## 2019-08-23 DIAGNOSIS — F17210 Nicotine dependence, cigarettes, uncomplicated: Secondary | ICD-10-CM | POA: Diagnosis not present

## 2019-08-23 DIAGNOSIS — J45909 Unspecified asthma, uncomplicated: Secondary | ICD-10-CM | POA: Diagnosis not present

## 2019-08-23 DIAGNOSIS — R0602 Shortness of breath: Secondary | ICD-10-CM

## 2019-08-23 DIAGNOSIS — J40 Bronchitis, not specified as acute or chronic: Secondary | ICD-10-CM | POA: Insufficient documentation

## 2019-08-23 DIAGNOSIS — Z720 Tobacco use: Secondary | ICD-10-CM

## 2019-08-23 DIAGNOSIS — R05 Cough: Secondary | ICD-10-CM | POA: Diagnosis present

## 2019-08-23 LAB — CBC WITH DIFFERENTIAL/PLATELET
Abs Immature Granulocytes: 0.03 10*3/uL (ref 0.00–0.07)
Basophils Absolute: 0.1 10*3/uL (ref 0.0–0.1)
Basophils Relative: 1 %
Eosinophils Absolute: 0.2 10*3/uL (ref 0.0–0.5)
Eosinophils Relative: 3 %
HCT: 48.4 % (ref 39.0–52.0)
Hemoglobin: 16.2 g/dL (ref 13.0–17.0)
Immature Granulocytes: 0 %
Lymphocytes Relative: 29 %
Lymphs Abs: 2.7 10*3/uL (ref 0.7–4.0)
MCH: 32.5 pg (ref 26.0–34.0)
MCHC: 33.5 g/dL (ref 30.0–36.0)
MCV: 97.2 fL (ref 80.0–100.0)
Monocytes Absolute: 0.7 10*3/uL (ref 0.1–1.0)
Monocytes Relative: 7 %
Neutro Abs: 5.8 10*3/uL (ref 1.7–7.7)
Neutrophils Relative %: 60 %
Platelets: 244 10*3/uL (ref 150–400)
RBC: 4.98 MIL/uL (ref 4.22–5.81)
RDW: 12.2 % (ref 11.5–15.5)
WBC: 9.6 10*3/uL (ref 4.0–10.5)
nRBC: 0 % (ref 0.0–0.2)

## 2019-08-23 LAB — BASIC METABOLIC PANEL
Anion gap: 12 (ref 5–15)
BUN: 14 mg/dL (ref 6–20)
CO2: 25 mmol/L (ref 22–32)
Calcium: 9.2 mg/dL (ref 8.9–10.3)
Chloride: 99 mmol/L (ref 98–111)
Creatinine, Ser: 0.77 mg/dL (ref 0.61–1.24)
GFR calc Af Amer: >60 mL/min (ref >60)
GFR calc non Af Amer: >60 mL/min (ref >60)
Glucose, Bld: 101 mg/dL — ABNORMAL HIGH (ref 70–99)
Potassium: 3.9 mmol/L (ref 3.5–5.1)
Sodium: 136 mmol/L (ref 135–145)

## 2019-08-23 LAB — TROPONIN I (HIGH SENSITIVITY)
Troponin I (High Sensitivity): 3 ng/L (ref <18)
Troponin I (High Sensitivity): 3 ng/L (ref <18)

## 2019-08-23 LAB — POC SARS CORONAVIRUS 2 AG -  ED: SARS Coronavirus 2 Ag: NEGATIVE

## 2019-08-23 MED ORDER — AEROCHAMBER PLUS FLO-VU MEDIUM MISC
1.0000 | Freq: Once | Status: AC
  Administered 2019-08-23: 14:00:00 1
  Filled 2019-08-23: qty 1

## 2019-08-23 MED ORDER — DOXYCYCLINE HYCLATE 100 MG PO CAPS: 100.0000 mg | ORAL_CAPSULE | Freq: Two times a day (BID) | ORAL | 0 refills | Status: DC

## 2019-08-23 MED ORDER — PREDNISONE 50 MG PO TABS
60.0000 mg | ORAL_TABLET | Freq: Once | ORAL | Status: AC
  Administered 2019-08-23: 14:00:00 60 mg via ORAL
  Filled 2019-08-23: qty 1

## 2019-08-23 MED ORDER — ACETAMINOPHEN 500 MG PO TABS
1000.0000 mg | ORAL_TABLET | Freq: Once | ORAL | Status: AC
  Administered 2019-08-23: 1000 mg via ORAL
  Filled 2019-08-23: qty 2

## 2019-08-23 MED ORDER — PREDNISONE 10 MG (21) PO TBPK: ORAL_TABLET | Freq: Every day | ORAL | 0 refills | Status: DC

## 2019-08-23 MED ORDER — ALBUTEROL SULFATE HFA 108 (90 BASE) MCG/ACT IN AERS
8.0000 | INHALATION_SPRAY | Freq: Once | RESPIRATORY_TRACT | Status: AC
  Administered 2019-08-23: 14:00:00 8 via RESPIRATORY_TRACT
  Filled 2019-08-23: qty 6.7

## 2019-08-23 MED ORDER — ALBUTEROL SULFATE HFA 108 (90 BASE) MCG/ACT IN AERS: 8.0000 | INHALATION_SPRAY | Freq: Four times a day (QID) | RESPIRATORY_TRACT | 0 refills | Status: DC | PRN

## 2019-08-23 NOTE — ED Provider Notes (Signed)
[facility] EMERGENCY DEPARTMENT Provider Note   CSN: [id number] Arrival date & time: 08/23/19  2426     History Chief Complaint  Patient presents with   Cough   Chest Pain    [name] is a 49 y.o. male h/o tobacco use, bronchitis presents to ER from [facility] for evaluation of SOB. Was told by UC to come to ER due to low oxygen level 88%.  Patient reports a mechnical fall on 12/22 with subsequent left lateral rib sharp pain. Went to UC and had normal chest xray without rib fractures, discharged with naproxen.  Pain initially improving but a few days later developed productive heavy cough, sputum, exertional shortness of breath, generalized malaise and left anterior chest pressure and tightness.  This chest pressure and tightness is constant, non radiating, non exertional and worse with cough.  His left sharp rib pain has worsened and worse with coughing, moving and at rest.  No interventions. No alleviating factors.  Tobacco use. Does not wear oxygen at home.   Currently unemployed. No sick contacts. No exposure to COVID. Wears a mask out in the community.  Denies fever, congestion, ST, vomiting, diarrhea, loss of taste or smell, headaches, body aches. No known CAD. No h/o PE/DVT, recent surgery, prolonged immobilization, leg swelling or calf pain, hemoptysis, hormone therapy or cancer.    HPI     Past Medical History:  Diagnosis Date   Asthma    History of pneumonia    History of substance abuse (HCC)    Cocaine   Panic attacks     There are no problems to display for this patient.   Past Surgical History:  Procedure Laterality Date   HERNIA REPAIR         Family History  Problem Relation Age of Onset   Coronary artery disease Father    Congenital heart disease Brother        Tetralogy of Fallot   Heart disease Maternal Grandmother     Social History   Tobacco Use   Smoking status: Current Every Day Smoker    Packs/day: 2.00    Types: Cigarettes   Start date: 06/18/1983   Smokeless tobacco: Never Used  Substance Use Topics   Alcohol use: Yes    Comment: daily 2 or more beers but says has cut back over the past week or so.   Drug use: No    Home Medications Prior to Admission medications   Medication Sig Start Date End Date Taking? Authorizing Provider  albuterol (PROVENTIL HFA;VENTOLIN HFA) 108 (90 BASE) MCG/ACT inhaler Inhale 1-2 puffs into the lungs every 6 (six) hours as needed for wheezing or shortness of breath. 11/10/14   Donnetta Hutching, MD  clonazePAM (KLONOPIN) 2 MG tablet Take 1-2 mg by mouth daily as needed for anxiety. Anxiety/panic attacks    [provider]  escitalopram (LEXAPRO) 20 MG tablet Take 1 tablet by mouth daily. 03/29/18   [provider]  Multiple Vitamins-Minerals (MULTIVITAMIN WITH MINERALS) tablet Take 1 tablet by mouth daily.    [provider]  naproxen (NAPROSYN) 375 MG tablet Take 1 tablet (375 mg total) by mouth 2 (two) times daily. 08/11/19   Wurst, [place], PA-C  vitamin B-12 (CYANOCOBALAMIN) 1000 MCG tablet Take 1,000 mcg by mouth daily.    [provider]    Allergies    Patient has no known allergies.  Review of Systems   Review of Systems  Respiratory: Positive for cough and shortness of breath.  Cardiovascular: Positive for chest pain.  All other systems reviewed and are negative.   Physical Exam Updated Vital Signs BP 133/72    Pulse 94    Temp 98.6 F (37 C) (Oral)    Resp 16    Ht 5\' 10"  (1.778 m)    Wt 93 kg    SpO2 93%    BMI 29.41 kg/m   Physical Exam Vitals and nursing note reviewed.  Constitutional:      General: He is not in acute distress.    Appearance: He is well-developed.     Comments: Looks tired, non toxic.   HENT:     Head: Normocephalic and atraumatic.     Right Ear: External ear normal.     Left Ear: External ear normal.     Nose: Nose normal.  Eyes:     General: No scleral icterus.    Conjunctiva/sclera: Conjunctivae  normal.  Cardiovascular:     Rate and Rhythm: Normal rate and regular rhythm.     Heart sounds: Normal heart sounds. No murmur.     Comments: No LE edema. No calf tenderness. No chest wall tenderness  Pulmonary:     Effort: Pulmonary effort is normal. Tachypnea present.     Breath sounds: Decreased breath sounds and wheezing present.     Comments: SpO2 90% when I enter the room briefly, up to >92% at rest.  Ambulated patient for 2 min and SpO2 dropped to 91% briefly, returned to >92%.  He reported feeling SOB but non toxic appearing throughout, speaking in full sentences.  End expiratory wheezing in upper / middle lobes noted. Diminished air sounds in lower lobes bilaterally. No crackles.  Musculoskeletal:        General: No deformity. Normal range of motion.     Cervical back: Normal range of motion and neck supple.  Skin:    General: Skin is warm and dry.     Capillary Refill: Capillary refill takes less than 2 seconds.  Neurological:     Mental Status: He is alert and oriented to person, place, and time.     Comments: Sensation and strength intact in bilateral extremities   Psychiatric:        Behavior: Behavior normal.        Thought Content: Thought content normal.        Judgment: Judgment normal.     ED Results / Procedures / Treatments   Labs (all labs ordered are listed, but only abnormal results are displayed) Labs Reviewed  BASIC METABOLIC PANEL - Abnormal; Notable for the following components:      Result Value   Glucose, Bld 101 (*)    All other components within normal limits  SARS CORONAVIRUS 2 (TAT 6-24 HRS)  CBC WITH DIFFERENTIAL/PLATELET  POC SARS CORONAVIRUS 2 AG -  ED  TROPONIN I (HIGH SENSITIVITY)  TROPONIN I (HIGH SENSITIVITY)    EKG EKG Interpretation  Date/Time:  Sunday August 23 2019 09:52:44 EST Ventricular Rate:  94 PR Interval:  144 QRS Duration: 98 QT Interval:  380 QTC Calculation: 475 R Axis:   3 Text Interpretation: Normal sinus  rhythm Normal ECG No STEMI Confirmed by Nanda Quinton [phone]) on 08/23/2019 9:57:25 AM   Radiology DG Chest Port 1 View  Result Date: 08/23/2019 CLINICAL DATA:  Pt reports he fell several weeks ago has had pain in left ribs. Reports was told he didn't have any fractures but continues to have pain. Reports pt started coughing over the  past week and sob. EXAM: PORTABLE CHEST 1 VIEW COMPARISON:  Chest radiograph 08/11/2019 FINDINGS: Stable cardiomediastinal contours. Coarse interstitial markings bilaterally likely represents chronic sequela of smoking. No new focal opacity. Calcified granuloma at the right lung base. No pneumothorax or significant pleural effusion. No acute finding in the visualized skeleton. IMPRESSION: 1. No evidence of active disease. 2. Bronchitic changes, likely chronic from smoking. Electronically Signed   By: Emmaline Kluver M.D.   On: 08/23/2019 12:18    Procedures Procedures (including critical care time)  Medications Ordered in ED Medications  predniSONE (DELTASONE) tablet 60 mg (60 mg Oral Given 08/23/19 1331)  albuterol (VENTOLIN HFA) 108 (90 Base) MCG/ACT inhaler 8 puff (8 puffs Inhalation Given 08/23/19 1331)  AeroChamber Plus Flo-Vu Medium MISC 1 each (1 each Other Given 08/23/19 1331)  acetaminophen (TYLENOL) tablet 1,000 mg (1,000 mg Oral Given 08/23/19 1331)    ED Course  I have reviewed the triage vital signs and the nursing notes.  Pertinent labs & imaging results that were available during my care of the patient were reviewed by me and considered in my medical decision making (see chart for details).  Clinical Course as of Aug 23 1427  Wynelle Link Aug 23, 2019  1258 EKG not crossing over. NSR.   ED EKG [CG]  1313 WBC: 9.6 [CG]  1313 Hemoglobin: 16.2 [CG]  1314 Stable cardiomediastinal contours. Coarse interstitial markings bilaterally likely represents chronic sequela of smoking. No new focal opacity. Calcified granuloma at the right lung base. No pneumothorax or  significant pleural effusion. No acute finding in the visualized skeleton.  IMPRESSION: 1. No evidence of active disease. 2. Bronchitic changes, likely chronic from smoking.  DG Chest Port 1 View [CG]    Clinical Course User Index [CG] Liberty Handy, PA-C   MDM Rules/Calculators/A&P                      Spo2 >90% after 2 min walk test here.  Overall appears tired but non toxic, speaking in full sentences.  There is wheezing on lung exam. No signs of hypervolemic, LE edema or calf tenderness.   Highest on ddx is viral respiratory illness vs CAP vs bronchitis/COPD exacerbation.  Doubt PE, PERC negative.  He has exertional CP/SOB but no known CAD and ACS less likely.    EMR reviewed and UC visit on 12/22 after mechanical fall and left rib pain reviewed, cxr negative at that time.    ER work up initiated in triage including labs, trop, EKG, CXR.   Will given albuterol 8 puffs, prednisone and re-ambulate since he reported oxygen 88% at UC but >90% here.    1426: ER work up personally reviewed vastly reassuring.  No leukocytosis. No anemia. Negative POC COVID.  EKG non ischemic. CXR with bronchitis changes but no focal infiltrate, edema, fractures.  Trop undetectable.  His chest pressure is likely due to COPD, cough/MSK.  I considered ACS highly unlikely given other infectious symptoms.   Patient ambulated a third time for 2 min and Spo2 > 93-95%.  He feels better after breathing treatment and prednisone.    Will dc with doxycycline, prednisone, albuterol for COPD exacerbation, early subclinical CAP.  Confirmatory COVID test pending at discharge.  Return precautions given.   Final Clinical Impression(s) / ED Diagnoses Final diagnoses:  Bronchitis due to tobacco use  Atypical chest pain    Rx / DC Orders ED Discharge Orders    None  Liberty Handy, PA-C 08/23/19 1429    Maia Plan, MD 08/23/19 1840

## 2019-08-23 NOTE — ED Triage Notes (Signed)
Pt presents with c/o cough and sob for past couple weeks pt desat to 88%

## 2019-08-23 NOTE — Discharge Instructions (Addendum)
You were seen in the ER for shortness of breath, cough, chest pain  Work up today was normal including chest x-ray, EKG, cardiac work up.  Initial rapid COVID test was NEGATIVE.  We have sent out confirmatory COVID swab that will be back in 24 hours.   I suspect your symptoms are from bronchitis or COPD flare or early pneumonia or a virus (including influenza or COVID or other)  We will treat your symptoms with antibiotics, prednisone and albuterol breathing treatments   Use 8 puffs of albuterol puff with spacer every 6 hours.    Take over the counter acetaminophen or ibuprofen every 6-8 hours as needed for chest pain  COVID test results come back in 24 hours, sometimes sooner.  Someone will call you to notify you of results.  You can also check MyChart for formal results that will be posted.   Treatment of your illness and symptoms for now will include self-isolation, monitoring of symptoms and supportive care with over-the-counter medicines.    Stay well-hydrated. Rest. You can use over the counter medications to help with symptoms: [phone] mg acetaminophen (tylenol) every 6 hours, around the clock to help with associated fevers, sore throat, headaches, generalized body aches and malaise.  Oxymetazoline (afrin) intranasal spray once daily for no more than 3 days to help with congestion, after 3 days you can switch to another over-the-counter nasal steroid spray such as fluticasone (flonase) Allergy medication (loratadine, cetirizine, etc) and phenylephrine (sudafed) help with nasal congestion, runny nose and postnasal drip.   Dextromethorphan (Delsym) to suppress dry cough. Frequent coughing is likely causing your chest wall pain Guaifenesin (Mucinex) to help expectorate mucus and cough Wash your hands often to prevent spread.  Stay hydrated with plenty of clear fluids Rest   Return to the ED if symptoms are worsening or severe, there is increased work of breathing, chest pain or  shortness of breath with exertion or activity, inability to tolerate fluids due to persistent vomiting despite nausea medicines, passing out, light headedness.  If your test results are POSITIVE, the following isolation requirements need to be met to return to work and resume essential activities: At least 10 days since symptom onset  72 hours of absence of fever without antifever medicine (ibuprofen, acetaminophen). A fever is temperature of 100.61F or greater. Improvement of respiratory symptoms  If your test is NEGATIVE, the following requirements need to be met to return to work and essential activities: 1. Your symptoms have improved  2.  You do not have a fever for a total of 3 days without antifever medicines (ibuprofen, acetaminophen).   3. You do not develop any other symptoms of COVID-19.  Call your job and notify them that your test result was negative to see if they will all you to return to work. Sometimes when a COVID test is done very early on in the illness, it can be false negative.  This means the test did not pick up the virus because it was too early.  If your symptoms are not improving or you develop new symptoms of COVID-19, you may need to be retested.      Infection Prevention Recommendations for Individuals Confirmed to have, or Being Evaluated for, or have symptoms of 2019 Novel Coronavirus (COVID-19) Infection Who Receive Care at Home  Individuals who are confirmed to have, or are being evaluated for, COVID-19 should follow the prevention steps below until a healthcare provider or local or state health department says they can return  to normal activities.  Stay home except to get medical care You should restrict activities outside your home, except for getting medical care. Do not go to work, school, or public areas, and do not use public transportation or taxis.  Call ahead before visiting your doctor Before your medical appointment, call the healthcare provider  and tell them that you have, or are being evaluated for, COVID-19 infection. This will help the healthcare provider's office take steps to keep other people from getting infected. Ask your healthcare provider to call the local or state health department.  Monitor your symptoms Seek prompt medical attention if your illness is worsening (e.g., difficulty breathing). Before going to your medical appointment, call the healthcare provider and tell them that you have, or are being evaluated for, COVID-19 infection. Ask your healthcare provider to call the local or state health department.  Wear a facemask You should wear a facemask that covers your nose and mouth when you are in the same room with other people and when you visit a healthcare provider. People who live with or visit you should also wear a facemask while they are in the same room with you.  Separate yourself from other people in your home As much as possible, you should stay in a different room from other people in your home. Also, you should use a separate bathroom, if available.  Avoid sharing household items You should not share dishes, drinking glasses, cups, eating utensils, towels, bedding, or other items with other people in your home. After using these items, you should wash them thoroughly with soap and water.  Cover your coughs and sneezes Cover your mouth and nose with a tissue when you cough or sneeze, or you can cough or sneeze into your sleeve. Throw used tissues in a lined trash can, and immediately wash your hands with soap and water for at least 20 seconds or use an alcohol-based hand rub.  Wash your [facility] your hands often and thoroughly with soap and water for at least 20 seconds. You can use an alcohol-based hand sanitizer if soap and water are not available and if your hands are not visibly dirty. Avoid touching your eyes, nose, and mouth with unwashed hands.   Prevention Steps for Caregivers and Household  Members of Individuals Confirmed to have, or Being Evaluated for, or have symptoms of 2019 Novel Coronavirus (COVID-19) Infection Being Cared for in the Home  If you live with, or provide care at home for, a person confirmed to have, or being evaluated for, COVID-19 infection please follow these guidelines to prevent infection:  Follow healthcare provider's instructions Make sure that you understand and can help the patient follow any healthcare provider instructions for all care.  Provide for the patient's basic needs You should help the patient with basic needs in the home and provide support for getting groceries, prescriptions, and other personal needs.  Monitor the patient's symptoms If they are getting sicker, call his or her medical provider and tell them that the patient has, or is being evaluated for, COVID-19 infection. This will help the healthcare provider's office take steps to keep other people from getting infected. Ask the healthcare provider to call the local or state health department.  Limit the number of people who have contact with the patient If possible, have only one caregiver for the patient. Other household members should stay in another home or place of residence. If this is not possible, they should stay in another room, or  be separated from the patient as much as possible. Use a separate bathroom, if available. Restrict visitors who do not have an essential need to be in the home.  Keep older adults, very young children, and other sick people away from the patient Keep older adults, very young children, and those who have compromised immune systems or chronic health conditions away from the patient. This includes people with chronic heart, lung, or kidney conditions, diabetes, and cancer.  Ensure good ventilation Make sure that shared spaces in the home have good air flow, such as from an air conditioner or an opened window, weather permitting.  Wash your  hands often Wash your hands often and thoroughly with soap and water for at least 20 seconds. You can use an alcohol based hand sanitizer if soap and water are not available and if your hands are not visibly dirty. Avoid touching your eyes, nose, and mouth with unwashed hands. Use disposable paper towels to dry your hands. If not available, use dedicated cloth towels and replace them when they become wet.  Wear a facemask and gloves Wear a disposable facemask at all times in the room and gloves when you touch or have contact with the patient's blood, body fluids, and/or secretions or excretions, such as sweat, saliva, sputum, nasal mucus, vomit, urine, or feces.  Ensure the mask fits over your nose and mouth tightly, and do not touch it during use. Throw out disposable facemasks and gloves after using them. Do not reuse. Wash your hands immediately after removing your facemask and gloves. If your personal clothing becomes contaminated, carefully remove clothing and launder. Wash your hands after handling contaminated clothing. Place all used disposable facemasks, gloves, and other waste in a lined container before disposing them with other household waste. Remove gloves and wash your hands immediately after handling these items.  Do not share dishes, glasses, or other household items with the patient Avoid sharing household items. You should not share dishes, drinking glasses, cups, eating utensils, towels, bedding, or other items with a patient who is confirmed to have, or being evaluated for, COVID-19 infection. After the person uses these items, you should wash them thoroughly with soap and water.  Wash laundry thoroughly Immediately remove and wash clothes or bedding that have blood, body fluids, and/or secretions or excretions, such as sweat, saliva, sputum, nasal mucus, vomit, urine, or feces, on them. Wear gloves when handling laundry from the patient. Read and follow directions on labels  of laundry or clothing items and detergent. In general, wash and dry with the warmest temperatures recommended on the label.  Clean all areas the individual has used often Clean all touchable surfaces, such as counters, tabletops, doorknobs, bathroom fixtures, toilets, phones, keyboards, tablets, and bedside tables, every day. Also, clean any surfaces that may have blood, body fluids, and/or secretions or excretions on them. Wear gloves when cleaning surfaces the patient has come in contact with. Use a diluted bleach solution (e.g., dilute bleach with 1 part bleach and 10 parts water) or a household disinfectant with a label that says EPA-registered for coronaviruses. To make a bleach solution at home, add 1 tablespoon of bleach to 1 quart (4 cups) of water. For a larger supply, add  cup of bleach to 1 gallon (16 cups) of water. Read labels of cleaning products and follow recommendations provided on product labels. Labels contain instructions for safe and effective use of the cleaning product including precautions you should take when applying the product, such as  wearing gloves or eye protection and making sure you have good ventilation during use of the product. Remove gloves and wash hands immediately after cleaning.  Monitor yourself for signs and symptoms of illness Caregivers and household members are considered close contacts, should monitor their health, and will be asked to limit movement outside of the home to the extent possible. Follow the monitoring steps for close contacts listed on the symptom monitoring form.  ? If you have additional questions, contact your local health department or call the epidemiologist on call at [phone] (available 24/7). ? This guidance is subject to change. For the most up-to-date guidance from [facility], please refer to their website: YouBlogs.pl

## 2019-08-23 NOTE — ED Triage Notes (Signed)
Pt reports he fell several weeks ago has had pain in left ribs.  Reports was told he didn't have any fractures but continues to have pain.  Reports pt started coughing over the past week and sob.  Pt says was sent from urgent care because they told him his oxygen was low.  Pt denies any fever.

## 2019-08-24 LAB — SARS CORONAVIRUS 2 (TAT 6-24 HRS): SARS Coronavirus 2: NEGATIVE

## 2019-09-25 ENCOUNTER — Other Ambulatory Visit: Payer: Self-pay

## 2019-09-25 ENCOUNTER — Ambulatory Visit
Admission: EM | Admit: 2019-09-25 | Discharge: 2019-09-25 | Disposition: A | Discharge status: Home / Self Care | Payer: Self-pay | Attending: Emergency Medicine | Admitting: Emergency Medicine

## 2019-09-25 ENCOUNTER — Encounter: Payer: Self-pay | Admitting: Emergency Medicine

## 2019-09-25 DIAGNOSIS — R0789 Other chest pain: Secondary | ICD-10-CM

## 2019-09-25 DIAGNOSIS — L03113 Cellulitis of right upper limb: Secondary | ICD-10-CM

## 2019-09-25 DIAGNOSIS — R0602 Shortness of breath: Secondary | ICD-10-CM

## 2019-09-25 NOTE — ED Triage Notes (Signed)
Pt presents to UC w/ c/o sob, chest tightness and right elbow abscess. Chest tightness and sob for a few days.

## 2019-09-25 NOTE — ED Provider Notes (Addendum)
[place]   [id number] 09/25/19 Arrival Time: 9798   CC: Multiple complaints  SUBJECTIVE:  [name] is a 49 y.o. male who presents with complaint of intermittent chest pain, pressure x few months, as well as worsening SOB, chest congestion, and chest tightness x 1 week.  Dx'ed with bronchitis apx 1 month ago.  Treated with antibiotics, prednisone, and inhaler with relief.  Localizes chest pain to the substernal region and RT chest.  Describes as intermittent, lasting apx half the day, and sharp in character.  Denies aggravating or alleviating factors. Reports radiating symptoms into his back.  Reports previous symptoms in the past related to pneumonia.  Complains of associated intermittent headaches with blurred vision and dizziness as well.  Denies fever, chills, nausea, vomiting, abdominal pain, changes in bowel or bladder habits.  Has been seen by cardiologist a few times with negative work-ups  Also complains of RT elbow pain, redness and swelling.  Concerned for infection  Admits to drinking 6-pack or 12-pack of beers every other day.  Denies withdrawal symptoms.    Admits to smoking 1-2 PPD for > 20 years.    ROS: As per HPI.  All other pertinent ROS negative.    Past Medical History:  Diagnosis Date  . Asthma   . History of pneumonia   . History of substance abuse ([place])    Cocaine  . Panic attacks    Past Surgical History:  Procedure Laterality Date  . HERNIA REPAIR     No Known Allergies No current facility-administered medications on file prior to encounter.   Current Outpatient Medications on File Prior to Encounter  Medication Sig Dispense Refill  . clonazePAM (KLONOPIN) 2 MG tablet Take 1-2 mg by mouth daily as needed for anxiety. Anxiety/panic attacks    . escitalopram (LEXAPRO) 20 MG tablet Take 1 tablet by mouth daily.  3  . [DISCONTINUED] albuterol (VENTOLIN HFA) 108 (90 Base) MCG/ACT inhaler Inhale 8 puffs into the lungs every 6 (six) hours  as needed for wheezing or shortness of breath. 18 g 0   Social History   Socioeconomic History  . Marital status: Single    Spouse name: Not on file  . Number of children: Not on file  . Years of education: Not on file  . Highest education level: Not on file  Occupational History  . Not on file  Tobacco Use  . Smoking status: Current Every Day Smoker    Packs/day: 2.00    Types: Cigarettes    Start date: 06/18/1983  . Smokeless tobacco: Never Used  . Tobacco comment: fo >20 years  Substance and Sexual Activity  . Alcohol use: Yes    Comment: daily 2 or more beers but says has cut back over the past week or so.  . Drug use: No  . Sexual activity: Not on file  Other Topics Concern  . Not on file  Social History Narrative  . Not on file   Social Determinants of Health   Financial Resource Strain:   . Difficulty of Paying Living Expenses: Not on file  Food Insecurity:   . Worried About Charity fundraiser in the Last Year: Not on file  . Ran Out of Food in the Last Year: Not on file  Transportation Needs:   . Lack of Transportation (Medical): Not on file  . Lack of Transportation (Non-Medical): Not on file  Physical Activity:   . Days of Exercise per Week: Not on file  .  Minutes of Exercise per Session: Not on file  Stress:   . Feeling of Stress : Not on file  Social Connections:   . Frequency of Communication with Friends and Family: Not on file  . Frequency of Social Gatherings with Friends and Family: Not on file  . Attends Religious Services: Not on file  . Active Member of Clubs or Organizations: Not on file  . Attends Banker Meetings: Not on file  . Marital Status: Not on file  Intimate Partner Violence:   . Fear of Current or Ex-Partner: Not on file  . Emotionally Abused: Not on file  . Physically Abused: Not on file  . Sexually Abused: Not on file   Family History  Problem Relation Age of Onset  . Coronary artery disease Father   .  Congenital heart disease Brother        Tetralogy of Fallot  . Heart disease Maternal Grandmother      OBJECTIVE:  Vitals:   09/25/19 1447  BP: (!) 157/89  Pulse: (!) 110  Resp: 17  Temp: 98.1 F (36.7 C)  TempSrc: Oral  SpO2: 94%    General appearance: alert; ill appearing, nontoxic Eyes: PERRLA; EOMI; conjunctiva normal HENT: normocephalic; atraumatic; EACs clear, TMs pearly gray; oropharynx clear, tonsil erythematous, swollen, uvula midline Neck: supple; no carotid bruits Lungs: decreased breath sounds Heart: regular rate and rhythm.  Chest Wall: no heaves, lifts or thrills Abdomen: soft, non-tender; bowel sounds normal; no guarding or rebound tenderness Extremities: RT elbow with area of erythema to posterior medial elbow apx 4 cm in diameter; otherwise symmetrical with no gross deformities; LT hand with tremor Neuro: CN 2-12 grossly intact; strength and sensation equal and intact bilaterally about the UEs and LEs; negative pronator drift; finger to nose without difficulty Skin: warm and dry Psychological: alert and cooperative; normal mood and affect  ECG: Orders placed or performed during the hospital encounter of 09/25/19  . ED EKG  . ED EKG    EKG normal sinus rhythm without ST elevations, depressions, or prolonged PR interval.  No narrowing or widening of the QRS complexes.  Comparable to past EKG.   ASSESSMENT & PLAN:  1. Other chest pain   2. Chest pressure   3. Shortness of breath   4. Chest tightness   5. Cellulitis of right elbow    Based on multiple complaints including chest pain/ pressure, shortness of breath and sharp chest pain that radiates to his back, unable to rule out cardiac disease or blood clot in urgent care setting.  Recommending further evaluation and management in the ED.  Patient aware and in agreement with plan.  Will go by private vehicle to [facility] ED.     Rennis Harding, PA-C 09/25/19 1529    Alvino Chapel [place], PA-C 09/25/19  1531

## 2019-09-25 NOTE — Discharge Instructions (Signed)
Based on multiple complaints including chest pain/ pressure, shortness of breath and sharp chest pain that radiates to his back, unable to rule out cardiac disease or blood clot in urgent care setting.  Recommending further evaluation and management in the ED.  Patient aware and in agreement with plan.  Will go by private vehicle to [facility] ED.

## 2019-09-29 ENCOUNTER — Emergency Department (HOSPITAL_COMMUNITY)
Admission: EM | Admit: 2019-09-29 | Discharge: 2019-09-29 | Disposition: A | Discharge status: Home / Self Care | Payer: Self-pay | Attending: Emergency Medicine | Admitting: Emergency Medicine

## 2019-09-29 ENCOUNTER — Encounter (HOSPITAL_COMMUNITY): Payer: Self-pay | Admitting: Emergency Medicine

## 2019-09-29 ENCOUNTER — Other Ambulatory Visit: Payer: Self-pay

## 2019-09-29 ENCOUNTER — Emergency Department (HOSPITAL_COMMUNITY): Payer: Self-pay

## 2019-09-29 DIAGNOSIS — J189 Pneumonia, unspecified organism: Secondary | ICD-10-CM

## 2019-09-29 DIAGNOSIS — F17210 Nicotine dependence, cigarettes, uncomplicated: Secondary | ICD-10-CM | POA: Insufficient documentation

## 2019-09-29 DIAGNOSIS — J45909 Unspecified asthma, uncomplicated: Secondary | ICD-10-CM | POA: Insufficient documentation

## 2019-09-29 DIAGNOSIS — R0789 Other chest pain: Secondary | ICD-10-CM | POA: Insufficient documentation

## 2019-09-29 DIAGNOSIS — L03113 Cellulitis of right upper limb: Secondary | ICD-10-CM

## 2019-09-29 LAB — CBC WITH DIFFERENTIAL/PLATELET
Abs Immature Granulocytes: 0.05 10*3/uL (ref 0.00–0.07)
Basophils Absolute: 0 10*3/uL (ref 0.0–0.1)
Basophils Relative: 0 %
Eosinophils Absolute: 0.3 10*3/uL (ref 0.0–0.5)
Eosinophils Relative: 3 %
HCT: 44.9 % (ref 39.0–52.0)
Hemoglobin: 14.9 g/dL (ref 13.0–17.0)
Immature Granulocytes: 0 %
Lymphocytes Relative: 26 %
Lymphs Abs: 3 10*3/uL (ref 0.7–4.0)
MCH: 32.3 pg (ref 26.0–34.0)
MCHC: 33.2 g/dL (ref 30.0–36.0)
MCV: 97.2 fL (ref 80.0–100.0)
Monocytes Absolute: 0.8 10*3/uL (ref 0.1–1.0)
Monocytes Relative: 7 %
Neutro Abs: 7.2 10*3/uL (ref 1.7–7.7)
Neutrophils Relative %: 64 %
Platelets: 258 10*3/uL (ref 150–400)
RBC: 4.62 MIL/uL (ref 4.22–5.81)
RDW: 12.1 % (ref 11.5–15.5)
WBC: 11.3 10*3/uL — ABNORMAL HIGH (ref 4.0–10.5)
nRBC: 0 % (ref 0.0–0.2)

## 2019-09-29 LAB — COMPREHENSIVE METABOLIC PANEL
ALT: 17 U/L (ref 0–44)
AST: 19 U/L (ref 15–41)
Albumin: 4 g/dL (ref 3.5–5.0)
Alkaline Phosphatase: 77 U/L (ref 38–126)
Anion gap: 12 (ref 5–15)
BUN: 9 mg/dL (ref 6–20)
CO2: 24 mmol/L (ref 22–32)
Calcium: 8.6 mg/dL — ABNORMAL LOW (ref 8.9–10.3)
Chloride: 98 mmol/L (ref 98–111)
Creatinine, Ser: 0.79 mg/dL (ref 0.61–1.24)
GFR calc Af Amer: >60 mL/min (ref >60)
GFR calc non Af Amer: >60 mL/min (ref >60)
Glucose, Bld: 162 mg/dL — ABNORMAL HIGH (ref 70–99)
Potassium: 3.2 mmol/L — ABNORMAL LOW (ref 3.5–5.1)
Sodium: 134 mmol/L — ABNORMAL LOW (ref 135–145)
Total Bilirubin: 0.7 mg/dL (ref 0.3–1.2)
Total Protein: 7.5 g/dL (ref 6.5–8.1)

## 2019-09-29 LAB — BRAIN NATRIURETIC PEPTIDE: B Natriuretic Peptide: 44 pg/mL (ref 0.0–100.0)

## 2019-09-29 LAB — TROPONIN I (HIGH SENSITIVITY)
Troponin I (High Sensitivity): 2 ng/L (ref <18)
Troponin I (High Sensitivity): 2 ng/L (ref <18)

## 2019-09-29 MED ORDER — SODIUM CHLORIDE 0.9 % IV BOLUS
1000.0000 mL | Freq: Once | INTRAVENOUS | Status: AC
  Administered 2019-09-29: 11:00:00 1000 mL via INTRAVENOUS

## 2019-09-29 MED ORDER — HYDROCODONE-ACETAMINOPHEN 5-325 MG PO TABS: 1.0000 | ORAL_TABLET | Freq: Four times a day (QID) | ORAL | 0 refills | Status: DC | PRN

## 2019-09-29 MED ORDER — SODIUM CHLORIDE 0.9 % IV SOLN
1.0000 g | Freq: Once | INTRAVENOUS | Status: AC
  Administered 2019-09-29: 12:00:00 1 g via INTRAVENOUS
  Filled 2019-09-29: qty 10

## 2019-09-29 MED ORDER — MORPHINE SULFATE (PF) 4 MG/ML IV SOLN
4.0000 mg | Freq: Once | INTRAVENOUS | Status: AC
  Administered 2019-09-29: 12:00:00 4 mg via INTRAVENOUS
  Filled 2019-09-29: qty 1

## 2019-09-29 MED ORDER — IOHEXOL 350 MG/ML SOLN
100.0000 mL | Freq: Once | INTRAVENOUS | Status: AC | PRN
  Administered 2019-09-29: 11:00:00 100 mL via INTRAVENOUS

## 2019-09-29 MED ORDER — CEPHALEXIN 500 MG PO CAPS: 500.0000 mg | ORAL_CAPSULE | Freq: Four times a day (QID) | ORAL | 0 refills | Status: DC

## 2019-09-29 MED ORDER — DOXYCYCLINE HYCLATE 100 MG PO CAPS: 100.0000 mg | ORAL_CAPSULE | Freq: Two times a day (BID) | ORAL | 0 refills | Status: DC

## 2019-09-29 NOTE — ED Triage Notes (Signed)
PT states he has had continued SOB, chest tightness since his dx of pneumonia around 3 weeks ago unrelieved by completed antibiotics. PT also states he has had right elbow pain since falling around 3 weeks ago and it started swelling and redness for the past week and had had generalized body aches. PT states he went to urgent care on 09/25/2019 and was told to come to the ED for evaluation then.

## 2019-09-29 NOTE — Discharge Instructions (Signed)
Begin taking Keflex and Bactrim as prescribed.  Hydrocodone as prescribed as needed for pain.  Follow-up with your primary doctor if symptoms or not improving in the next few days.

## 2019-09-29 NOTE — ED Provider Notes (Signed)
[facility] EMERGENCY DEPARTMENT Provider Note   CSN: [id number] Arrival date & time: 09/29/19  0901     History Chief Complaint  Patient presents with  . Shortness of Breath  . Abscess    [name] is a 49 y.o. male.  Patient is a 49 year old male with past medical history of asthma, panic attacks.  He presents today for evaluation of shortness of breath and tightness in his chest.  Patient tells me he was diagnosed with bronchitis/pneumonia about 3 weeks ago.  He was prescribed antibiotics which seemed to help somewhat.  His symptoms have now returned as has his breathing difficulty and tightness in the center of his chest.  He denies fevers or chills.  He denies ill contacts.  He denies any exposure to Covid and has tested negative in the past.  He denies any swelling in his legs.  The history is provided by the patient.  Shortness of Breath Severity:  Moderate Onset quality:  Gradual Duration:  3 weeks Timing:  Constant Progression:  Worsening Chronicity:  New Context: activity and URI   Relieved by:  Nothing Worsened by:  Activity Ineffective treatments: Unknown antibiotic prescribed at urgent care. Associated symptoms: sputum production   Associated symptoms: no fever   Abscess Associated symptoms: no fever        Past Medical History:  Diagnosis Date  . Asthma   . History of pneumonia   . History of substance abuse ([facility])    Cocaine  . Panic attacks     There are no problems to display for this patient.   Past Surgical History:  Procedure Laterality Date  . HERNIA REPAIR         Family History  Problem Relation Age of Onset  . Coronary artery disease Father   . Congenital heart disease Brother        Tetralogy of Fallot  . Heart disease Maternal Grandmother     Social History   Tobacco Use  . Smoking status: Current Every Day Smoker    Packs/day: 2.00    Types: Cigarettes    Start date: 06/18/1983  . Smokeless tobacco: Never Used  .  Tobacco comment: fo >20 years  Substance Use Topics  . Alcohol use: Yes    Comment: daily 2 or more beers but says has cut back over the past week or so.  . Drug use: No    Home Medications Prior to Admission medications   Medication Sig Start Date End Date Taking? Authorizing Provider  clonazePAM (KLONOPIN) 2 MG tablet Take 1-2 mg by mouth daily as needed for anxiety. Anxiety/panic attacks    [provider]  escitalopram (LEXAPRO) 20 MG tablet Take 1 tablet by mouth daily. 03/29/18   [provider]  albuterol (VENTOLIN HFA) 108 (90 Base) MCG/ACT inhaler Inhale 8 puffs into the lungs every 6 (six) hours as needed for wheezing or shortness of breath. 08/23/19 09/25/19  Liberty Handy, PA-C    Allergies    Patient has no known allergies.  Review of Systems   Review of Systems  Constitutional: Negative for fever.  Respiratory: Positive for sputum production and shortness of breath.   All other systems reviewed and are negative.   Physical Exam Updated Vital Signs BP (!) 133/97 (BP Location: Left Arm)   Pulse (!) 107   Temp 98.7 F (37.1 C) (Oral)   Resp 20   Ht 5\' 11"  (1.803 m)   Wt 97.5 kg   SpO2  92%   BMI 29.99 kg/m   Physical Exam Vitals and nursing note reviewed.  Constitutional:      General: He is not in acute distress.    Appearance: He is well-developed. He is not diaphoretic.  HENT:     Head: Normocephalic and atraumatic.  Cardiovascular:     Rate and Rhythm: Normal rate and regular rhythm.     Heart sounds: No murmur. No friction rub.  Pulmonary:     Effort: Pulmonary effort is normal. No respiratory distress.     Breath sounds: Normal breath sounds. No wheezing or rales.  Abdominal:     General: Bowel sounds are normal. There is no distension.     Palpations: Abdomen is soft.     Tenderness: There is no abdominal tenderness.  Musculoskeletal:        General: Normal range of motion.     Cervical back: Normal range of motion and neck  supple.     Right lower leg: No tenderness. No edema.     Left lower leg: No tenderness. No edema.  Skin:    General: Skin is warm and dry.  Neurological:     Mental Status: He is alert and oriented to person, place, and time.     Coordination: Coordination normal.     ED Results / Procedures / Treatments   Labs (all labs ordered are listed, but only abnormal results are displayed) Labs Reviewed  COMPREHENSIVE METABOLIC PANEL  CBC WITH DIFFERENTIAL/PLATELET  BRAIN NATRIURETIC PEPTIDE  TROPONIN I (HIGH SENSITIVITY)    EKG EKG Interpretation  Date/Time:  Tuesday September 29 2019 09:16:27 EST Ventricular Rate:  105 PR Interval:    QRS Duration: 104 QT Interval:  365 QTC Calculation: 481 R Axis:   -144 Text Interpretation: Sinus or ectopic atrial tachycardia Probable lateral infarct, old Confirmed by Veryl Speak [phone]) on 09/29/2019 9:26:12 AM   Radiology No results found.  Procedures Procedures (including critical care time)  Medications Ordered in ED Medications  sodium chloride 0.9 % bolus 1,000 mL (has no administration in time range)    ED Course  I have reviewed the triage vital signs and the nursing notes.  Pertinent labs & imaging results that were available during my care of the patient were reviewed by me and considered in my medical decision making (see chart for details).    MDM Rules/Calculators/A&P  Patient with right elbow cellulitis/bursitis and what appears to be residual pneumonia, whether bacterial or [id number], I am uncertain.  Either way as the patient is not improving, he will be treated with antibiotics both for cellulitis of the elbow and pneumonia.  CT scan is negative for pulmonary embolism.  Rocephin given in the ER and he will be discharged with Keflex and doxycycline.  He is not hypoxic and vitals are stable otherwise.  He is to return as needed for any problems.  Final Clinical Impression(s) / ED Diagnoses Final diagnoses:  None      Rx / DC Orders ED Discharge Orders    None       Veryl Speak, MD 09/29/19 1335

## 2019-09-29 NOTE — ED Notes (Signed)
Pt gone over to CT 

## 2021-03-23 ENCOUNTER — Emergency Department (HOSPITAL_COMMUNITY)
Admission: EM | Admit: 2021-03-23 | Discharge: 2021-03-23 | Disposition: A | Discharge status: Home / Self Care | Payer: 59 | Attending: Emergency Medicine | Admitting: Emergency Medicine

## 2021-03-23 ENCOUNTER — Emergency Department (HOSPITAL_COMMUNITY): Payer: 59

## 2021-03-23 ENCOUNTER — Other Ambulatory Visit: Payer: Self-pay

## 2021-03-23 DIAGNOSIS — S0081XA Abrasion of other part of head, initial encounter: Secondary | ICD-10-CM | POA: Diagnosis not present

## 2021-03-23 DIAGNOSIS — Y92410 Unspecified street and highway as the place of occurrence of the external cause: Secondary | ICD-10-CM | POA: Diagnosis not present

## 2021-03-23 DIAGNOSIS — S1191XA Laceration without foreign body of unspecified part of neck, initial encounter: Secondary | ICD-10-CM | POA: Diagnosis present

## 2021-03-23 DIAGNOSIS — Z23 Encounter for immunization: Secondary | ICD-10-CM | POA: Diagnosis not present

## 2021-03-23 DIAGNOSIS — J45909 Unspecified asthma, uncomplicated: Secondary | ICD-10-CM | POA: Diagnosis not present

## 2021-03-23 DIAGNOSIS — T07XXXA Unspecified multiple injuries, initial encounter: Secondary | ICD-10-CM

## 2021-03-23 DIAGNOSIS — F17210 Nicotine dependence, cigarettes, uncomplicated: Secondary | ICD-10-CM | POA: Insufficient documentation

## 2021-03-23 DIAGNOSIS — S40022A Contusion of left upper arm, initial encounter: Secondary | ICD-10-CM | POA: Insufficient documentation

## 2021-03-23 MED ORDER — OXYCODONE-ACETAMINOPHEN 5-325 MG PO TABS
1.0000 | ORAL_TABLET | Freq: Once | ORAL | Status: AC
  Administered 2021-03-23: 1 via ORAL
  Filled 2021-03-23: qty 1

## 2021-03-23 MED ORDER — TETANUS-DIPHTH-ACELL PERTUSSIS 5-2.5-18.5 LF-MCG/0.5 IM SUSY
0.5000 mL | PREFILLED_SYRINGE | Freq: Once | INTRAMUSCULAR | Status: AC
  Administered 2021-03-23: 0.5 mL via INTRAMUSCULAR
  Filled 2021-03-23: qty 0.5

## 2021-03-23 MED ORDER — FENTANYL CITRATE (PF) 100 MCG/2ML IJ SOLN
100.0000 ug | INTRAMUSCULAR | Status: DC | PRN
  Administered 2021-03-23 (×2): 100 ug via INTRAVENOUS
  Filled 2021-03-23 (×2): qty 2

## 2021-03-23 MED ORDER — OXYCODONE-ACETAMINOPHEN 5-325 MG PO TABS: 1.0000 | ORAL_TABLET | Freq: Four times a day (QID) | ORAL | 0 refills | Status: DC | PRN

## 2021-03-23 MED ORDER — ONDANSETRON HCL 4 MG/2ML IJ SOLN
4.0000 mg | Freq: Once | INTRAMUSCULAR | Status: AC
  Administered 2021-03-23: 4 mg via INTRAVENOUS
  Filled 2021-03-23: qty 2
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
## 2021-03-23 NOTE — Discharge Instructions (Addendum)
Use ice on the sore spots or 4 times a day for 2 days after that use heat.  Clean the wounds been using a warm compress on them 3 or 4 times a day and clean them well with soap and water.  Apply an antibiotic ointment like bacitracin or Neosporin to help the wound heal.  Return here, if needed, for problems.
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
## 2021-03-24 ENCOUNTER — Emergency Department (HOSPITAL_COMMUNITY): Payer: 59

## 2021-03-24 ENCOUNTER — Emergency Department (HOSPITAL_COMMUNITY)
Admission: EM | Admit: 2021-03-24 | Discharge: 2021-03-24 | Disposition: A | Discharge status: Home / Self Care | Payer: 59 | Attending: Emergency Medicine | Admitting: Emergency Medicine

## 2021-03-24 ENCOUNTER — Encounter (HOSPITAL_COMMUNITY): Payer: Self-pay | Admitting: Pharmacy Technician

## 2021-03-24 DIAGNOSIS — M542 Cervicalgia: Secondary | ICD-10-CM | POA: Diagnosis not present

## 2021-03-24 DIAGNOSIS — Z5321 Procedure and treatment not carried out due to patient leaving prior to being seen by health care provider: Secondary | ICD-10-CM | POA: Diagnosis not present

## 2021-03-24 DIAGNOSIS — M25512 Pain in left shoulder: Secondary | ICD-10-CM | POA: Diagnosis present

## 2021-03-24 DIAGNOSIS — Y92410 Unspecified street and highway as the place of occurrence of the external cause: Secondary | ICD-10-CM | POA: Diagnosis not present

## 2021-03-24 NOTE — ED Notes (Signed)
Pt did not want to wait and left AMA. 
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
## 2022-02-16 ENCOUNTER — Encounter: Payer: Self-pay | Admitting: Family Medicine

## 2022-02-16 ENCOUNTER — Ambulatory Visit (INDEPENDENT_AMBULATORY_CARE_PROVIDER_SITE_OTHER): Payer: Self-pay | Admitting: Family Medicine

## 2022-02-16 VITALS — BP 139/79 | HR 100 | Ht 71.0 in | Wt 220.6 lb

## 2022-02-16 DIAGNOSIS — Z0001 Encounter for general adult medical examination with abnormal findings: Secondary | ICD-10-CM

## 2022-02-16 DIAGNOSIS — R7301 Impaired fasting glucose: Secondary | ICD-10-CM

## 2022-02-16 DIAGNOSIS — I10 Essential (primary) hypertension: Secondary | ICD-10-CM

## 2022-02-16 DIAGNOSIS — Z1159 Encounter for screening for other viral diseases: Secondary | ICD-10-CM

## 2022-02-16 DIAGNOSIS — F32A Depression, unspecified: Secondary | ICD-10-CM | POA: Insufficient documentation

## 2022-02-16 DIAGNOSIS — F419 Anxiety disorder, unspecified: Secondary | ICD-10-CM

## 2022-02-16 DIAGNOSIS — E559 Vitamin D deficiency, unspecified: Secondary | ICD-10-CM

## 2022-02-16 DIAGNOSIS — Z720 Tobacco use: Secondary | ICD-10-CM

## 2022-02-16 MED ORDER — LISINOPRIL 20 MG PO TABS: 20.0000 mg | ORAL_TABLET | Freq: Every day | ORAL | 3 refills | Status: DC

## 2022-02-16 MED ORDER — ESCITALOPRAM OXALATE 20 MG PO TABS: 20.0000 mg | ORAL_TABLET | Freq: Every day | ORAL | 2 refills | Status: DC

## 2022-02-16 MED ORDER — AMLODIPINE BESYLATE 5 MG PO TABS: 5.0000 mg | ORAL_TABLET | Freq: Every day | ORAL | 0 refills | Status: DC

## 2022-02-16 NOTE — Patient Instructions (Addendum)
I appreciate the opportunity to provide care to you today!    Follow up:  1 months  Labs: please stop by the lab today to get your blood drawn (CBC, CMP, TSH, Lipid profile, HgA1c, Vit D)  Screening: Hep C  Please pick up your medications at the pharmacy    Please continue to a heart-healthy diet and increase your physical activities. Try to exercise for at least three times a week.      It was a pleasure to see you and I look forward to continuing to work together on your health and well-being. Please do not hesitate to call the office if you need care or have questions about your care.   Have a wonderful day and week. With Gratitude, Gilmore Laroche MSN, FNP-BC

## 2022-02-16 NOTE — Assessment & Plan Note (Signed)
Refilled lexapro

## 2022-02-16 NOTE — Progress Notes (Signed)
New Patient Office Visit  Subjective:  Patient ID: [name], male    DOB: 04/06/1971  Age: 51 y.o. MRN: [id number]  CC:  Chief Complaint  Patient presents with   New Patient (Initial Visit)    Pt establishing care, needs refills, needs general work up and routine labs.     HPI AUGUST LONGEST is a 51 y.o. male with past medical history of depression and anxiety presents for establishing care. No complaints or concerns today. He needs a refill of Lexapro and BP meds.   He reports Smoking 1ppd. He was smoking 2ppd but is cutting back. He quit drinking 2&1/2 years ago.  Past Medical History:  Diagnosis Date   Asthma    History of pneumonia    History of substance abuse ([place])    Cocaine   Panic attacks     Past Surgical History:  Procedure Laterality Date   HERNIA REPAIR      Family History  Problem Relation Age of Onset   Coronary artery disease Father    Congenital heart disease Brother        Tetralogy of Fallot   Heart disease Maternal Grandmother     Social History   Socioeconomic History   Marital status: Single    Spouse name: Not on file   Number of children: Not on file   Years of education: Not on file   Highest education level: Not on file  Occupational History   Not on file  Tobacco Use   Smoking status: Every Day    Packs/day: 2.00    Types: Cigarettes    Start date: 06/18/1983   Smokeless tobacco: Never   Tobacco comments:    fo >20 years  Vaping Use   Vaping Use: Never used  Substance and Sexual Activity   Alcohol use: Yes    Comment: daily 2 or more beers but says has cut back over the past week or so.   Drug use: No   Sexual activity: Not on file  Other Topics Concern   Not on file  Social History Narrative   Not on file   Social Determinants of Health   Financial Resource Strain: Not on file  Food Insecurity: Not on file  Transportation Needs: Not on file  Physical Activity: Not on file  Stress: Not on file  Social  Connections: Not on file  Intimate Partner Violence: Not on file    ROS Review of Systems  Constitutional:  Negative for chills, fatigue and fever.  HENT:  Negative for congestion, sinus pressure and sinus pain.   Eyes:  Negative for photophobia, pain and redness.  Respiratory:  Negative for chest tightness and shortness of breath.   Cardiovascular:  Negative for chest pain and palpitations.  Gastrointestinal:  Negative for diarrhea, nausea and vomiting.  Endocrine: Negative for polydipsia, polyphagia and polyuria.  Genitourinary:  Negative for frequency and urgency.  Musculoskeletal:  Negative for joint swelling and myalgias.  Skin:  Negative for pallor and rash.  Neurological:  Negative for dizziness, tremors and headaches.  Psychiatric/Behavioral:  Negative for self-injury and suicidal ideas.     Objective:   Today's Vitals: BP 139/79   Pulse 100   Ht '5\' 11"'  (1.803 m)   Wt 220 lb 9.6 oz (100.1 kg)   SpO2 93%   BMI 30.77 kg/m   Physical Exam HENT:     Head: Normocephalic.     Right Ear: External ear normal.  Left Ear: External ear normal.     Nose: No congestion.     Mouth/Throat:     Mouth: Mucous membranes are moist.  Eyes:     Extraocular Movements: Extraocular movements intact.     Pupils: Pupils are equal, round, and reactive to light.  Cardiovascular:     Rate and Rhythm: Normal rate and regular rhythm.     Pulses: Normal pulses.     Heart sounds: Normal heart sounds.  Pulmonary:     Effort: Pulmonary effort is normal.  Abdominal:     Palpations: Abdomen is soft.  Musculoskeletal:     Cervical back: No rigidity.     Right lower leg: No edema.     Left lower leg: No edema.  Skin:    General: Skin is warm.  Neurological:     Mental Status: He is alert and oriented to person, place, and time.  Psychiatric:     Comments: Normal affect     Assessment & Plan:   Problem List Items Addressed This Visit       Cardiovascular and Mediastinum    Essential hypertension    Reports being out of bp meds for 6 months Refilled lisinopril and norvasc      Relevant Medications   lisinopril (ZESTRIL) 20 MG tablet   amLODipine (NORVASC) 5 MG tablet     Other   Anxiety and depression - Primary    Refilled lexapro      Relevant Medications   escitalopram (LEXAPRO) 20 MG tablet   Tobacco use    Smokes about 1 pack/day  Asked about quitting: confirms that he currently smokes cigarettes Advise to quit smoking: Educated about QUITTING to reduce the risk of cancer, cardio and cerebrovascular disease. Assess willingness: Unwilling to quit at this time, but is working on cutting back. Assist with counseling and pharmacotherapy: Counseled for 5 minutes and literature provided. Arrange for follow up: follow up in 3 months and continue to offer help.      Other Visit Diagnoses     Need for hepatitis C screening test       Relevant Orders   Hepatitis C Antibody   Vitamin D deficiency       Relevant Orders   Vitamin D (25 hydroxy)   IFG (impaired fasting glucose)       Relevant Orders   Hemoglobin A1C   Encounter for general adult medical examination with abnormal findings       Relevant Orders   CBC with Differential/Platelet   CMP14+EGFR   TSH + free T4   Lipid panel       Outpatient Encounter Medications as of 02/16/2022  Medication Sig   amLODipine (NORVASC) 5 MG tablet Take 1 tablet (5 mg total) by mouth daily.   lisinopril (ZESTRIL) 20 MG tablet Take 1 tablet (20 mg total) by mouth daily.   cephALEXin (KEFLEX) 500 MG capsule Take 1 capsule (500 mg total) by mouth 4 (four) times daily. (Patient not taking: Reported on 02/16/2022)   clonazePAM (KLONOPIN) 2 MG tablet Take 1-2 mg by mouth daily as needed for anxiety. Anxiety/panic attacks (Patient not taking: Reported on 02/16/2022)   doxycycline (VIBRAMYCIN) 100 MG capsule Take 1 capsule (100 mg total) by mouth 2 (two) times daily. One po bid x 7 days (Patient not taking:  Reported on 02/16/2022)   escitalopram (LEXAPRO) 20 MG tablet Take 1 tablet (20 mg total) by mouth daily.   HYDROcodone-acetaminophen (NORCO) 5-325 MG tablet Take 1-2 tablets  by mouth every 6 (six) hours as needed. (Patient not taking: Reported on 02/16/2022)   oxyCODONE-acetaminophen (PERCOCET/ROXICET) 5-325 MG tablet Take 1 tablet by mouth every 6 (six) hours as needed for severe pain or moderate pain. (Patient not taking: Reported on 02/16/2022)   [DISCONTINUED] albuterol (VENTOLIN HFA) 108 (90 Base) MCG/ACT inhaler Inhale 8 puffs into the lungs every 6 (six) hours as needed for wheezing or shortness of breath.   [DISCONTINUED] escitalopram (LEXAPRO) 20 MG tablet Take 1 tablet by mouth daily. (Patient not taking: Reported on 02/16/2022)   No facility-administered encounter medications on file as of 02/16/2022.    Follow-up: Return in about 1 month (around 03/18/2022) for BP.   Alvira Monday, FNP

## 2022-02-16 NOTE — Assessment & Plan Note (Signed)
Reports being out of bp meds for 6 months Refilled lisinopril and norvasc

## 2022-02-16 NOTE — Assessment & Plan Note (Signed)
Smokes about 1 pack/day  Asked about quitting: confirms that he currently smokes cigarettes Advise to quit smoking: Educated about QUITTING to reduce the risk of cancer, cardio and cerebrovascular disease. Assess willingness: Unwilling to quit at this time, but is working on cutting back. Assist with counseling and pharmacotherapy: Counseled for 5 minutes and literature provided. Arrange for follow up: follow up in 3 months and continue to offer help.  

## 2022-04-06 ENCOUNTER — Ambulatory Visit: Payer: Self-pay | Admitting: Family Medicine

## 2022-05-08 ENCOUNTER — Other Ambulatory Visit: Payer: Self-pay | Admitting: Family Medicine

## 2022-05-08 DIAGNOSIS — F419 Anxiety disorder, unspecified: Secondary | ICD-10-CM

## 2022-06-06 ENCOUNTER — Other Ambulatory Visit: Payer: Self-pay | Admitting: Family Medicine

## 2022-06-06 DIAGNOSIS — F419 Anxiety disorder, unspecified: Secondary | ICD-10-CM

## 2022-10-10 ENCOUNTER — Other Ambulatory Visit: Payer: Self-pay

## 2022-12-05 ENCOUNTER — Telehealth: Payer: Self-pay | Admitting: Family Medicine

## 2022-12-05 NOTE — Telephone Encounter (Signed)
Prescription Request  12/05/2022  LOV: 02/16/2022  What is the name of the medication or equipment? lisinopril (ZESTRIL) 20 MG tablet  escitalopram (LEXAPRO) 20 MG tablet   Have you contacted your pharmacy to request a refill? No   Which pharmacy would you like this sent to?  [facility] [phone] - [place],  - [address] AT [facility] [address] [place] [address] Phone: [phone] Fax: [phone]  [facility] [address] [place] - [address] [place] [address] [place] [address] [place] [address] Phone: [phone] Fax: [phone]    Patient notified that their request is being sent to the clinical staff for review and that they should receive a response within 2 business days.   Please advise at Mobile [phone] (mobile)

## 2022-12-05 NOTE — Telephone Encounter (Signed)
Not seen since 12/2021 has an appt 12/12/2022

## 2022-12-12 ENCOUNTER — Encounter: Payer: Self-pay | Admitting: Family Medicine

## 2022-12-12 ENCOUNTER — Ambulatory Visit (INDEPENDENT_AMBULATORY_CARE_PROVIDER_SITE_OTHER): Payer: Self-pay | Admitting: Family Medicine

## 2022-12-12 VITALS — BP 132/83 | HR 80 | Ht 71.0 in | Wt 223.1 lb

## 2022-12-12 DIAGNOSIS — E559 Vitamin D deficiency, unspecified: Secondary | ICD-10-CM

## 2022-12-12 DIAGNOSIS — E038 Other specified hypothyroidism: Secondary | ICD-10-CM

## 2022-12-12 DIAGNOSIS — F419 Anxiety disorder, unspecified: Secondary | ICD-10-CM

## 2022-12-12 DIAGNOSIS — I10 Essential (primary) hypertension: Secondary | ICD-10-CM

## 2022-12-12 DIAGNOSIS — F32A Depression, unspecified: Secondary | ICD-10-CM

## 2022-12-12 DIAGNOSIS — Z1159 Encounter for screening for other viral diseases: Secondary | ICD-10-CM

## 2022-12-12 DIAGNOSIS — R7301 Impaired fasting glucose: Secondary | ICD-10-CM

## 2022-12-12 DIAGNOSIS — E7849 Other hyperlipidemia: Secondary | ICD-10-CM

## 2022-12-12 MED ORDER — LISINOPRIL 20 MG PO TABS: 20.0000 mg | ORAL_TABLET | Freq: Every day | ORAL | 3 refills | Status: DC

## 2022-12-12 MED ORDER — AMLODIPINE BESYLATE 5 MG PO TABS: 5.0000 mg | ORAL_TABLET | Freq: Every day | ORAL | 3 refills | Status: DC

## 2022-12-12 MED ORDER — ESCITALOPRAM OXALATE 20 MG PO TABS: 20.0000 mg | ORAL_TABLET | Freq: Every day | ORAL | 0 refills | Status: DC

## 2022-12-12 NOTE — Patient Instructions (Addendum)
I appreciate the opportunity to provide care to you today!    Follow up:  4 months  Labs: please stop by the lab during the week to get your blood drawn (CBC, CMP, TSH, Lipid profile, HgA1c, Vit D)  Screening:  Hep C  Please stop by your local pharmacy and  Shingles vaccine    Please continue to a heart-healthy diet and increase your physical activities. Try to exercise for at least five days a week.      It was a pleasure to see you and I look forward to continuing to work together on your health and well-being. Please do not hesitate to call the office if you need care or have questions about your care.   Have a wonderful day and week. With Gratitude, Gilmore Laroche MSN, FNP-BC

## 2022-12-12 NOTE — Progress Notes (Unsigned)
Established Patient Office Visit  Subjective:  Patient ID: [name], male    DOB: 1970-08-21  Age: 52 y.o. MRN: [id number]  CC:  Chief Complaint  Patient presents with   Anxiety    Pt would like a refill on lexapro.    Hypertension    Needs refill on bp medication.     HPI [name] is a 52 y.o. male with past medical history of hypertension, anxiety presents for f/u of  chronic medical conditions. For the details of today's visit, please refer to the assessment and plan.     Past Medical History:  Diagnosis Date   Asthma    History of pneumonia    History of substance abuse    Cocaine   Panic attacks     Past Surgical History:  Procedure Laterality Date   HERNIA REPAIR      Family History  Problem Relation Age of Onset   Coronary artery disease Father    Congenital heart disease Brother        Tetralogy of Fallot   Heart disease Maternal Grandmother     Social History   Socioeconomic History   Marital status: Single    Spouse name: Not on file   Number of children: Not on file   Years of education: Not on file   Highest education level: Not on file  Occupational History   Not on file  Tobacco Use   Smoking status: Every Day    Packs/day: 2    Types: Cigarettes    Start date: 06/18/1983   Smokeless tobacco: Never   Tobacco comments:    2 pack a day.  Vaping Use   Vaping Use: Never used  Substance and Sexual Activity   Alcohol use: Yes    Comment: daily 2 or more beers but says has cut back over the past week or so.   Drug use: No   Sexual activity: Not on file  Other Topics Concern   Not on file  Social History Narrative   Not on file   Social Determinants of Health   Financial Resource Strain: Not on file  Food Insecurity: Not on file  Transportation Needs: Not on file  Physical Activity: Not on file  Stress: Not on file  Social Connections: Not on file  Intimate Partner Violence: Not on file    Outpatient Medications  Prior to Visit  Medication Sig Dispense Refill   cephALEXin (KEFLEX) 500 MG capsule Take 1 capsule (500 mg total) by mouth 4 (four) times daily. 28 capsule 0   clonazePAM (KLONOPIN) 2 MG tablet Take 1-2 mg by mouth daily as needed for anxiety. Anxiety/panic attacks     doxycycline (VIBRAMYCIN) 100 MG capsule Take 1 capsule (100 mg total) by mouth 2 (two) times daily. One po bid x 7 days 14 capsule 0   HYDROcodone-acetaminophen (NORCO) 5-325 MG tablet Take 1-2 tablets by mouth every 6 (six) hours as needed. 12 tablet 0   oxyCODONE-acetaminophen (PERCOCET/ROXICET) 5-325 MG tablet Take 1 tablet by mouth every 6 (six) hours as needed for severe pain or moderate pain. 15 tablet 0   amLODipine (NORVASC) 5 MG tablet Take 1 tablet (5 mg total) by mouth daily. 90 tablet 0   escitalopram (LEXAPRO) 20 MG tablet Take 1 tablet by mouth once daily 30 tablet 0   lisinopril (ZESTRIL) 20 MG tablet Take 1 tablet (20 mg total) by mouth daily. 90 tablet 3   No facility-administered medications prior  to visit.    No Known Allergies  ROS Review of Systems  Constitutional:  Negative for fatigue and fever.  Eyes:  Negative for visual disturbance.  Respiratory:  Negative for chest tightness and shortness of breath.   Cardiovascular:  Negative for chest pain and palpitations.  Neurological:  Negative for dizziness and headaches.      Objective:    Physical Exam HENT:     Head: Normocephalic.     Right Ear: External ear normal.     Left Ear: External ear normal.     Nose: No congestion or rhinorrhea.     Mouth/Throat:     Mouth: Mucous membranes are moist.  Cardiovascular:     Rate and Rhythm: Regular rhythm.     Heart sounds: No murmur heard. Pulmonary:     Effort: No respiratory distress.     Breath sounds: Normal breath sounds.  Neurological:     Mental Status: He is alert.     BP 132/83   Pulse 80   Ht  (1.803 m)   Wt 223 lb 1.9 oz (101.2 kg)   SpO2 93%   BMI 31.12 kg/m  Wt  Readings from Last 3 Encounters:  12/12/22 223 lb 1.9 oz (101.2 kg)  02/16/22 220 lb 9.6 oz (100.1 kg)  03/23/21 214 lb 15.2 oz (97.5 kg)     Lab Results  Component Value Date   WBC 11.3 (H) 09/29/2019   HGB 14.9 09/29/2019   HCT 44.9 09/29/2019   MCV 97.2 09/29/2019   PLT 258 09/29/2019   Lab Results  Component Value Date   NA 134 (L) 09/29/2019   K 3.2 (L) 09/29/2019   CO2 24 09/29/2019   GLUCOSE 162 (H) 09/29/2019   BUN 9 09/29/2019   CREATININE 0.79 09/29/2019   BILITOT 0.7 09/29/2019   ALKPHOS 77 09/29/2019   AST 19 09/29/2019   ALT 17 09/29/2019   PROT 7.5 09/29/2019   ALBUMIN 4.0 09/29/2019   CALCIUM 8.6 (L) 09/29/2019   ANIONGAP 12 09/29/2019   Lab Results  Component Value Date   CHOL (H) 11/24/2008    246        ATP III CLASSIFICATION:  <200     mg/dL   Desirable  478-295  mg/dL   Borderline High  >=621    mg/dL   High          CHOL (H) 11/24/2008    242        ATP III CLASSIFICATION:  <200     mg/dL   Desirable  308-657  mg/dL   Borderline High  >=846    mg/dL   High          Lab Results  Component Value Date   HDL 26 (L) 11/24/2008   HDL 23 (L) 11/24/2008   Lab Results  Component Value Date   LDLCALC  11/24/2008    UNABLE TO CALCULATE IF TRIGLYCERIDE OVER 400 mg/dL        Total Cholesterol/HDL:CHD Risk Coronary Heart Disease Risk Table                     Men   Women  1/2 Average Risk   3.4   3.3  Average Risk       5.0   4.4  2 X Average Risk   9.6   7.1  3 X Average Risk  23.4   11.0        Use the  calculated Patient Ratio above and the CHD Risk Table to determine the patient's CHD Risk.        ATP III CLASSIFICATION (LDL):  <100     mg/dL   Optimal  161-096  mg/dL   Near or Above                    Optimal  130-159  mg/dL   Borderline  045-409  mg/dL   High  >811     mg/dL   Very High   LDLCALC  11/24/2008    UNABLE TO CALCULATE IF TRIGLYCERIDE OVER 400 mg/dL        Total Cholesterol/HDL:CHD Risk Coronary Heart Disease Risk  Table                     Men   Women  1/2 Average Risk   3.4   3.3  Average Risk       5.0   4.4  2 X Average Risk   9.6   7.1  3 X Average Risk  23.4   11.0        Use the calculated Patient Ratio above and the CHD Risk Table to determine the patient's CHD Risk.        ATP III CLASSIFICATION (LDL):  <100     mg/dL   Optimal  914-782  mg/dL   Near or Above                    Optimal  130-159  mg/dL   Borderline  956-213  mg/dL   High  >086     mg/dL   Very High   Lab Results  Component Value Date   TRIG 429 (H) 11/24/2008   TRIG 424 (H) 11/24/2008   Lab Results  Component Value Date   CHOLHDL 9.5 11/24/2008   CHOLHDL 10.5 11/24/2008   No results found for: "HGBA1C"    Assessment & Plan:  Anxiety and depression Assessment & Plan: GAD-7 is 0 He takes Lexapro 20 mg daily Denies suicidal thoughts and ideation   Orders: -     Escitalopram Oxalate; Take 1 tablet (20 mg total) by mouth daily.  Dispense: 30 tablet; Refill: 0  Essential hypertension Assessment & Plan: He takes amlodipine 5 mg daily and lisinopril 20 mg daily Requesting a refill today Asymptomatic in the clinic Low-sodium diet increase physical activity encouraged BP Readings from Last 3 Encounters:  12/12/22 132/83  02/16/22 139/79  03/24/21 107/67     Orders: -     amLODIPine Besylate; Take 1 tablet (5 mg total) by mouth daily.  Dispense: 90 tablet; Refill: 3 -     Lisinopril; Take 1 tablet (20 mg total) by mouth daily.  Dispense: 90 tablet; Refill: 3 -     CMP14+EGFR -     CBC with Differential/Platelet  Vitamin D deficiency -     VITAMIN D 25 Hydroxy (Vit-D Deficiency, Fractures)  IFG (impaired fasting glucose) -     Hemoglobin A1c  Other specified hypothyroidism -     TSH + free T4  Other hyperlipidemia -     Lipid panel  Need for hepatitis C screening test -     Hepatitis C antibody    Follow-up: Return in about 4 months (around 04/13/2023).   Gilmore Laroche, FNP

## 2022-12-13 NOTE — Assessment & Plan Note (Signed)
GAD-7 is 0 He takes Lexapro 20 mg daily Denies suicidal thoughts and ideation

## 2022-12-13 NOTE — Assessment & Plan Note (Signed)
He takes amlodipine 5 mg daily and lisinopril 20 mg daily Requesting a refill today Asymptomatic in the clinic Low-sodium diet increase physical activity encouraged BP Readings from Last 3 Encounters:  12/12/22 132/83  02/16/22 139/79  03/24/21 107/67

## 2023-01-06 ENCOUNTER — Other Ambulatory Visit: Payer: Self-pay | Admitting: Family Medicine

## 2023-01-06 DIAGNOSIS — F32A Depression, unspecified: Secondary | ICD-10-CM

## 2023-02-03 ENCOUNTER — Other Ambulatory Visit: Payer: Self-pay | Admitting: Family Medicine

## 2023-02-03 DIAGNOSIS — F419 Anxiety disorder, unspecified: Secondary | ICD-10-CM

## 2023-03-05 ENCOUNTER — Other Ambulatory Visit: Payer: Self-pay | Admitting: Family Medicine

## 2023-03-05 DIAGNOSIS — F419 Anxiety disorder, unspecified: Secondary | ICD-10-CM

## 2023-04-17 ENCOUNTER — Ambulatory Visit (INDEPENDENT_AMBULATORY_CARE_PROVIDER_SITE_OTHER): Payer: Self-pay | Admitting: Family Medicine

## 2023-04-17 ENCOUNTER — Encounter: Payer: Self-pay | Admitting: Family Medicine

## 2023-04-17 VITALS — BP 146/85 | HR 71 | Ht 71.0 in | Wt 219.0 lb

## 2023-04-17 DIAGNOSIS — F32A Depression, unspecified: Secondary | ICD-10-CM

## 2023-04-17 DIAGNOSIS — F419 Anxiety disorder, unspecified: Secondary | ICD-10-CM

## 2023-04-17 DIAGNOSIS — I10 Essential (primary) hypertension: Secondary | ICD-10-CM

## 2023-04-17 DIAGNOSIS — Z720 Tobacco use: Secondary | ICD-10-CM

## 2023-04-17 DIAGNOSIS — M79672 Pain in left foot: Secondary | ICD-10-CM

## 2023-04-17 DIAGNOSIS — G8929 Other chronic pain: Secondary | ICD-10-CM

## 2023-04-17 MED ORDER — ESCITALOPRAM OXALATE 20 MG PO TABS
20.0000 mg | ORAL_TABLET | Freq: Every day | ORAL | 2 refills | Status: DC
Start: 2023-04-17 — End: 2023-08-19

## 2023-04-17 MED ORDER — ACETAMINOPHEN 500 MG PO TABS
1000.0000 mg | ORAL_TABLET | Freq: Three times a day (TID) | ORAL | 0 refills | Status: AC | PRN
Start: 2023-04-17 — End: ?

## 2023-04-17 MED ORDER — LISINOPRIL 20 MG PO TABS
20.0000 mg | ORAL_TABLET | Freq: Every day | ORAL | 3 refills | Status: AC
Start: 2023-04-17 — End: ?

## 2023-04-17 MED ORDER — PREDNISONE 20 MG PO TABS
40.0000 mg | ORAL_TABLET | Freq: Every day | ORAL | 0 refills | Status: AC
Start: 2023-04-17 — End: 2023-04-22

## 2023-04-17 NOTE — Assessment & Plan Note (Signed)
Uncontrolled, likely due to the current state of pain The patient is currently taking amlodipine 5 mg daily and lisinopril 20 mg daily.  The patient is asymptomatic in the clinic at this time. I recommended that the patient adheres to a low-sodium diet and increases physical activity to help manage blood pressure effectively BP Readings from Last 3 Encounters:  04/17/23 (!) 146/85  12/12/22 132/83  02/16/22 139/79

## 2023-04-17 NOTE — Patient Instructions (Addendum)
I appreciate the opportunity to provide care to you today!    Follow up:  3 months  Labs: please stop by the lab today to get your blood drawn (CBC, CMP, TSH, Lipid profile, HgA1c, Vit D)  Screening: HIV and Hep C  Left Foot Pain: Likely Plantar Fasciitis  Medications:Prednisone: 40 mg daily for 5 days. Tylenol (Acetaminophen): 1000 mg every 8 hours as needed for pain relief.  -Rest: Allow your foot to rest to promote healing. However, avoid complete inactivity, as this may lead to increased pain and stiffness over time. Avoid Aggravating Factors: -Pressure Relief: Reduce pressure on the heel by using a prefabricated silicone insert or heel pad. Ice Therapy: -Application: Apply ice to the heel for 20 minutes, up to 4 times daily. Use a thin towel between the ice and your skin to prevent frostbite. Consider icing and massaging your foot before engaging in exercise. -Exercises: Perform specific exercises to alleviate heel pain, including heel raises, seated calf stretches, ankle circles, toe curls, and foot taping. Engage in these exercises daily. -Shoes: Wear sturdy sneakers with ample cushioning and good arch and heel support. Consider shoes with rigid soles and adding padded or gel heel inserts to further reduce pressure on the heel. Night Splints: -Usage: Wearing a splint while sleeping can provide relief by keeping your foot in a neutral position. These splints are available at pharmacies and medical supply stores.   Referrals today- orthopedic surgery    Please continue to a heart-healthy diet and increase your physical activities. Try to exercise for at least five days a week.    It was a pleasure to see you and I look forward to continuing to work together on your health and well-being. Please do not hesitate to call the office if you need care or have questions about your care.  In case of emergency, please visit the Emergency Department for urgent care, or contact our  clinic at [phone] to schedule an appointment. We're here to help you!   Have a wonderful day and week. With Gratitude, Gilmore Laroche MSN, FNP-BC

## 2023-04-17 NOTE — Assessment & Plan Note (Signed)
Smokes 1 pack of cigarettes daily I recommend smoking cessation as a critical step for improving your overall health. Smoking significantly increases your risk for a range of serious health issues, including cancer, chronic obstructive pulmonary disease (COPD), high blood pressure, cataracts, and various digestive problems. Additionally, smoking can lead to oral health issues such as gum disease, mouth sores, tooth loss, and diminished taste and smell. It also irritates the throat and contributes to persistent coughing. Quitting smoking can greatly reduce these risks and improve your quality of life.

## 2023-04-17 NOTE — Assessment & Plan Note (Signed)
GAD-7 is 0 PHQ-9 is 0 He takes Lexapro 20 mg daily Denies suicidal thoughts and ideation Nonpharmacologic management of anxiety and depression  Mindfulness and Meditation Practices like mindfulness meditation can help reduce symptoms by promoting relaxation and present-moment awareness.  Exercise  Regular physical activity has been shown to improve mood and reduce anxiety through the release of endorphins and other neurochemicals.  Healthy Diet Eating a balanced diet rich in fruits, vegetables, whole grains, and lean proteins can support overall mental health.  Sleep Hygiene  Establishing a regular sleep routine and ensuring good sleep quality can significantly impact mood and anxiety levels.  Stress Management Techniques Activities such as yoga, tai chi, and deep breathing exercises can help manage stress.  Social Support Maintaining strong relationships and seeking support from friends, family, or support groups can provide emotional comfort and reduce feelings of isolation.  Lifestyle Modifications Reducing alcohol and caffeine intake, quitting smoking, and avoiding recreational drugs can improve symptoms.  Art and Music Therapy Engaging in creative activities like painting, drawing, or playing music can be therapeutic and help express emotions.  Light Therapy Particularly useful for seasonal affective disorder (SAD), exposure to bright light can help regulate mood. Marland Kitchen

## 2023-04-17 NOTE — Assessment & Plan Note (Signed)
The patient has experienced ongoing symptoms for the past 2 months, consistent with plantar fasciitis. The pain, described as sharp and burning, is primarily located at the bottom of the heel and is exacerbated by increased activity, particularly intense with the first steps in the morning. The pain is currently rated 9-10.  The patient has been using non-pharmacological interventions such as ice and insoles with minimal relief.  To address the symptoms, we will initiate a short course of oral steroid therapy and recommend taking Tylenol 1000 mg every 8 hours as needed for pain relief. Additionally, a referral has been made to orthopedic surgery for a possible steroid injection.

## 2023-04-17 NOTE — Progress Notes (Signed)
Established Patient Office Visit  Subjective:  Patient ID: [name], male    DOB: Apr 19, 1971  Age: 52 y.o. MRN: [id number]  CC:  Chief Complaint  Patient presents with   Care Management    4 month follow up   Foot Pain    Pt reports left foot pain ongoing for 2 months. Pain is a 9 out of 10, states he has been missing work due to this problem.     HPI [name] is a 52 y.o. male with past medical history of hypertension, anxiety, and tobacco use presents for f/u of  chronic medical conditions. For the details of today's visit, please refer to the assessment and plan.     Past Medical History:  Diagnosis Date   Asthma    History of pneumonia    History of substance abuse (HCC)    Cocaine   Panic attacks     Past Surgical History:  Procedure Laterality Date   HERNIA REPAIR      Family History  Problem Relation Age of Onset   Coronary artery disease Father    Congenital heart disease Brother        Tetralogy of Fallot   Heart disease Maternal Grandmother     Social History   Socioeconomic History   Marital status: Single    Spouse name: Not on file   Number of children: Not on file   Years of education: Not on file   Highest education level: Not on file  Occupational History   Not on file  Tobacco Use   Smoking status: Every Day    Current packs/day: 2.00    Average packs/day: 2.0 packs/day for 39.8 years (79.7 ttl pk-yrs)    Types: Cigarettes    Start date: 06/18/1983   Smokeless tobacco: Never   Tobacco comments:    1 pack a day  Vaping Use   Vaping status: Never Used  Substance and Sexual Activity   Alcohol use: Yes    Comment: daily 2 or more beers but says has cut back over the past week or so.   Drug use: Yes    Types: Marijuana   Sexual activity: Not on file  Other Topics Concern   Not on file  Social History Narrative   Not on file   Social Determinants of Health   Financial Resource Strain: Not on file  Food Insecurity:  Not on file  Transportation Needs: Not on file  Physical Activity: Not on file  Stress: Not on file  Social Connections: Not on file  Intimate Partner Violence: Not on file    Outpatient Medications Prior to Visit  Medication Sig Dispense Refill   cephALEXin (KEFLEX) 500 MG capsule Take 1 capsule (500 mg total) by mouth 4 (four) times daily. 28 capsule 0   escitalopram (LEXAPRO) 20 MG tablet Take 1 tablet by mouth once daily 30 tablet 0   escitalopram (LEXAPRO) 20 MG tablet Take 20 mg by mouth daily.     lisinopril (ZESTRIL) 20 MG tablet Take 1 tablet (20 mg total) by mouth daily. 90 tablet 3   amLODipine (NORVASC) 5 MG tablet Take 1 tablet (5 mg total) by mouth daily. (Patient not taking: Reported on 04/17/2023) 90 tablet 3   clonazePAM (KLONOPIN) 2 MG tablet Take 1-2 mg by mouth daily as needed for anxiety. Anxiety/panic attacks (Patient not taking: Reported on 04/17/2023)     doxycycline (VIBRAMYCIN) 100 MG capsule Take 1 capsule (100 mg total)  by mouth 2 (two) times daily. One po bid x 7 days 14 capsule 0   HYDROcodone-acetaminophen (NORCO) 5-325 MG tablet Take 1-2 tablets by mouth every 6 (six) hours as needed. 12 tablet 0   oxyCODONE-acetaminophen (PERCOCET/ROXICET) 5-325 MG tablet Take 1 tablet by mouth every 6 (six) hours as needed for severe pain or moderate pain. 15 tablet 0   No facility-administered medications prior to visit.    No Known Allergies  ROS Review of Systems  Constitutional:  Negative for fatigue and fever.  Eyes:  Negative for visual disturbance.  Respiratory:  Negative for chest tightness and shortness of breath.   Cardiovascular:  Negative for chest pain and palpitations.  Musculoskeletal:        Left heel pain  Neurological:  Negative for dizziness and headaches.      Objective:    Physical Exam HENT:     Head: Normocephalic.     Right Ear: External ear normal.     Left Ear: External ear normal.     Nose: No congestion or rhinorrhea.      Mouth/Throat:     Mouth: Mucous membranes are moist.  Cardiovascular:     Rate and Rhythm: Regular rhythm.     Heart sounds: No murmur heard. Pulmonary:     Effort: No respiratory distress.     Breath sounds: Normal breath sounds.  Musculoskeletal:       Feet:  Feet:     Comments: Tenderness with palpation of the marked site Neurological:     Mental Status: He is alert.     BP (!) 146/85   Pulse 71   Ht 5\' 11"  (1.803 m)   Wt 219 lb (99.3 kg)   SpO2 93%   BMI 30.54 kg/m  Wt Readings from Last 3 Encounters:  04/17/23 219 lb (99.3 kg)  12/12/22 223 lb 1.9 oz (101.2 kg)  02/16/22 220 lb 9.6 oz (100.1 kg)     Lab Results  Component Value Date   WBC 11.3 (H) 09/29/2019   HGB 14.9 09/29/2019   HCT 44.9 09/29/2019   MCV 97.2 09/29/2019   PLT 258 09/29/2019   Lab Results  Component Value Date   NA 134 (L) 09/29/2019   K 3.2 (L) 09/29/2019   CO2 24 09/29/2019   GLUCOSE 162 (H) 09/29/2019   BUN 9 09/29/2019   CREATININE 0.79 09/29/2019   BILITOT 0.7 09/29/2019   ALKPHOS 77 09/29/2019   AST 19 09/29/2019   ALT 17 09/29/2019   PROT 7.5 09/29/2019   ALBUMIN 4.0 09/29/2019   CALCIUM 8.6 (L) 09/29/2019   ANIONGAP 12 09/29/2019   Lab Results  Component Value Date   CHOL (H) 11/24/2008    246        ATP III CLASSIFICATION:  <200     mg/dL   Desirable  528-413  mg/dL   Borderline High  >=244    mg/dL   High          CHOL (H) 11/24/2008    242        ATP III CLASSIFICATION:  <200     mg/dL   Desirable  010-272  mg/dL   Borderline High  >=536    mg/dL   High          Lab Results  Component Value Date   HDL 26 (L) 11/24/2008   HDL 23 (L) 11/24/2008   Lab Results  Component Value Date   [facility]  11/24/2008  UNABLE TO CALCULATE IF TRIGLYCERIDE OVER 400 mg/dL        Total Cholesterol/HDL:CHD Risk Coronary Heart Disease Risk Table                     Men   Women  1/2 Average Risk   3.4   3.3  Average Risk       5.0   4.4  2 X Average Risk   9.6   7.1   3 X Average Risk  23.4   11.0        Use the calculated Patient Ratio above and the CHD Risk Table to determine the patient's CHD Risk.        ATP III CLASSIFICATION (LDL):  <100     mg/dL   Optimal  161-096  mg/dL   Near or Above                    Optimal  130-159  mg/dL   Borderline  045-409  mg/dL   High  >811     mg/dL   Very High   LDLCALC  11/24/2008    UNABLE TO CALCULATE IF TRIGLYCERIDE OVER 400 mg/dL        Total Cholesterol/HDL:CHD Risk Coronary Heart Disease Risk Table                     Men   Women  1/2 Average Risk   3.4   3.3  Average Risk       5.0   4.4  2 X Average Risk   9.6   7.1  3 X Average Risk  23.4   11.0        Use the calculated Patient Ratio above and the CHD Risk Table to determine the patient's CHD Risk.        ATP III CLASSIFICATION (LDL):  <100     mg/dL   Optimal  914-782  mg/dL   Near or Above                    Optimal  130-159  mg/dL   Borderline  956-213  mg/dL   High  >086     mg/dL   Very High   Lab Results  Component Value Date   TRIG 429 (H) 11/24/2008   TRIG 424 (H) 11/24/2008   Lab Results  Component Value Date   CHOLHDL 9.5 11/24/2008   CHOLHDL 10.5 11/24/2008   No results found for: "HGBA1C"    Assessment & Plan:  Essential hypertension Assessment & Plan: Uncontrolled, likely due to the current state of pain The patient is currently taking amlodipine 5 mg daily and lisinopril 20 mg daily.  The patient is asymptomatic in the clinic at this time. I recommended that the patient adheres to a low-sodium diet and increases physical activity to help manage blood pressure effectively BP Readings from Last 3 Encounters:  04/17/23 (!) 146/85  12/12/22 132/83  02/16/22 139/79     Orders: -     Lisinopril; Take 1 tablet (20 mg total) by mouth daily.  Dispense: 90 tablet; Refill: 3  Chronic pain in left foot Assessment & Plan: The patient has experienced ongoing symptoms for the past 2 months, consistent with plantar  fasciitis. The pain, described as sharp and burning, is primarily located at the bottom of the heel and is exacerbated by increased activity, particularly intense with the first steps in the morning. The pain  is currently rated 9-10.  The patient has been using non-pharmacological interventions such as ice and insoles with minimal relief.  To address the symptoms, we will initiate a short course of oral steroid therapy and recommend taking Tylenol 1000 mg every 8 hours as needed for pain relief. Additionally, a referral has been made to orthopedic surgery for a possible steroid injection.  Orders: -     predniSONE; Take 2 tablets (40 mg total) by mouth daily with breakfast for 5 days.  Dispense: 10 tablet; Refill: 0 -     Acetaminophen; Take 2 tablets (1,000 mg total) by mouth every 8 (eight) hours as needed.  Dispense: 30 tablet; Refill: 0 -     Ambulatory referral to Orthopedic Surgery  Anxiety and depression Assessment & Plan: GAD-7 is 0 PHQ-9 is 0 He takes Lexapro 20 mg daily Denies suicidal thoughts and ideation Nonpharmacologic management of anxiety and depression  Mindfulness and Meditation Practices like mindfulness meditation can help reduce symptoms by promoting relaxation and present-moment awareness.  Exercise  Regular physical activity has been shown to improve mood and reduce anxiety through the release of endorphins and other neurochemicals.  Healthy Diet Eating a balanced diet rich in fruits, vegetables, whole grains, and lean proteins can support overall mental health.  Sleep Hygiene  Establishing a regular sleep routine and ensuring good sleep quality can significantly impact mood and anxiety levels.  Stress Management Techniques Activities such as yoga, tai chi, and deep breathing exercises can help manage stress.  Social Support Maintaining strong relationships and seeking support from friends, family, or support groups can provide emotional comfort and reduce  feelings of isolation.  Lifestyle Modifications Reducing alcohol and caffeine intake, quitting smoking, and avoiding recreational drugs can improve symptoms.  Art and Music Therapy Engaging in creative activities like painting, drawing, or playing music can be therapeutic and help express emotions.  Light Therapy Particularly useful for seasonal affective disorder (SAD), exposure to bright light can help regulate mood. .    Orders: -     Escitalopram Oxalate; Take 1 tablet (20 mg total) by mouth daily.  Dispense: 30 tablet; Refill: 2  Tobacco use Assessment & Plan: Smokes 1 pack of cigarettes daily I recommend smoking cessation as a critical step for improving your overall health. Smoking significantly increases your risk for a range of serious health issues, including cancer, chronic obstructive pulmonary disease (COPD), high blood pressure, cataracts, and various digestive problems. Additionally, smoking can lead to oral health issues such as gum disease, mouth sores, tooth loss, and diminished taste and smell. It also irritates the throat and contributes to persistent coughing. Quitting smoking can greatly reduce these risks and improve your quality of life.        Note: This chart has been completed using Engineer, civil (consulting) software, and while attempts have been made to ensure accuracy, certain words and phrases may not be transcribed as intended.    Follow-up: Return in about 3 months (around 07/18/2023).   Gilmore Laroche, FNP

## 2023-04-23 ENCOUNTER — Ambulatory Visit (INDEPENDENT_AMBULATORY_CARE_PROVIDER_SITE_OTHER): Payer: Self-pay | Admitting: Orthopaedic Surgery

## 2023-04-23 ENCOUNTER — Other Ambulatory Visit (INDEPENDENT_AMBULATORY_CARE_PROVIDER_SITE_OTHER): Payer: Self-pay

## 2023-04-23 ENCOUNTER — Encounter: Payer: Self-pay | Admitting: Orthopaedic Surgery

## 2023-04-23 VITALS — BP 158/97 | HR 61 | Ht 70.0 in | Wt 220.0 lb

## 2023-04-23 DIAGNOSIS — M79672 Pain in left foot: Secondary | ICD-10-CM

## 2023-04-23 DIAGNOSIS — G8929 Other chronic pain: Secondary | ICD-10-CM

## 2023-04-23 MED ORDER — HYDROCODONE-ACETAMINOPHEN 5-325 MG PO TABS
1.0000 | ORAL_TABLET | ORAL | 0 refills | Status: AC | PRN
Start: 2023-04-23 — End: 2023-04-28

## 2023-04-23 MED ORDER — NAPROXEN 500 MG PO TABS: 500.0000 mg | ORAL_TABLET | Freq: Two times a day (BID) | ORAL | 5 refills | Status: AC

## 2023-04-23 NOTE — Progress Notes (Signed)
Subjective:    Patient ID: [name], male    DOB: July 17, 1971, 52 y.o.   MRN: [id number]  HPI He has pain in the left heel area for about a month or two, getting worse recently.  He has tried Advil, Tylenol, ice, shoe inserts and seen his primary care.  He was put on prednisone dose pack which helped some but the pain returned.  He has no trauma.  He has pain first thing in the morning and at the end of the day.  He has no redness or swelling.   Review of Systems  Constitutional:  Positive for activity change.  Respiratory:  Positive for shortness of breath.   Musculoskeletal:  Positive for arthralgias and gait problem.  All other systems reviewed and are negative. For Review of Systems, all other systems reviewed and are negative.  The following is a summary of the past history medically, past history surgically, known current medicines, social history and family history.  This information is gathered electronically by the computer from prior information and documentation.  I review this each visit and have found including this information at this point in the chart is beneficial and informative.   Past Medical History:  Diagnosis Date   Asthma    History of pneumonia    History of substance abuse (HCC)    Cocaine   Panic attacks     Past Surgical History:  Procedure Laterality Date   HERNIA REPAIR      Current Outpatient Medications on File Prior to Visit  Medication Sig Dispense Refill   acetaminophen (TYLENOL) 500 MG tablet Take 2 tablets (1,000 mg total) by mouth every 8 (eight) hours as needed. 30 tablet 0   escitalopram (LEXAPRO) 20 MG tablet Take 1 tablet (20 mg total) by mouth daily. 30 tablet 2   lisinopril (ZESTRIL) 20 MG tablet Take 1 tablet (20 mg total) by mouth daily. 90 tablet 3   [DISCONTINUED] albuterol (VENTOLIN HFA) 108 (90 Base) MCG/ACT inhaler Inhale 8 puffs into the lungs every 6 (six) hours as needed for wheezing or shortness of breath. 18 g 0    No current facility-administered medications on file prior to visit.    Social History   Socioeconomic History   Marital status: Single    Spouse name: Not on file   Number of children: Not on file   Years of education: Not on file   Highest education level: Not on file  Occupational History   Not on file  Tobacco Use   Smoking status: Every Day    Current packs/day: 2.00    Average packs/day: 2.0 packs/day for 39.8 years (79.7 ttl pk-yrs)    Types: Cigarettes    Start date: 06/18/1983   Smokeless tobacco: Never   Tobacco comments:    1 pack a day  Vaping Use   Vaping status: Never Used  Substance and Sexual Activity   Alcohol use: Yes    Comment: daily 2 or more beers but says has cut back over the past week or so.   Drug use: Yes    Types: Marijuana   Sexual activity: Not on file  Other Topics Concern   Not on file  Social History Narrative   Not on file   Social Determinants of Health   Financial Resource Strain: Not on file  Food Insecurity: Not on file  Transportation Needs: Not on file  Physical Activity: Not on file  Stress: Not on file  Social Connections:  Not on file  Intimate Partner Violence: Not on file    Family History  Problem Relation Age of Onset   Coronary artery disease Father    Congenital heart disease Brother        Tetralogy of Fallot   Heart disease Maternal Grandmother     BP (!) 158/97   Pulse 61   Ht 5\' 10"  (1.778 m)   Wt 220 lb (99.8 kg)   BMI 31.57 kg/m   Body mass index is 31.57 kg/m.      Objective:   Physical Exam Vitals and nursing note reviewed. Exam conducted with a chaperone present.  Constitutional:      Appearance: He is well-developed.  HENT:     Head: Normocephalic and atraumatic.  Eyes:     Conjunctiva/sclera: Conjunctivae normal.     Pupils: Pupils are equal, round, and reactive to light.  Cardiovascular:     Rate and Rhythm: Normal rate and regular rhythm.  Pulmonary:     Effort: Pulmonary  effort is normal.  Abdominal:     Palpations: Abdomen is soft.  Musculoskeletal:     Cervical back: Normal range of motion and neck supple.       Feet:  Skin:    General: Skin is warm and dry.  Neurological:     Mental Status: He is alert and oriented to person, place, and time.     Cranial Nerves: No cranial nerve deficit.     Motor: No abnormal muscle tone.     Coordination: Coordination normal.     Deep Tendon Reflexes: Reflexes are normal and symmetric. Reflexes normal.  Psychiatric:        Behavior: Behavior normal.        Thought Content: Thought content normal.        Judgment: Judgment normal.   X-rays were done of the left foot, reported separately.        Assessment & Plan:   Encounter Diagnosis  Name Primary?   Chronic pain in left foot Yes   I have explained stretching exercises to do.  I will give pain medicine.  I have reviewed the [place] Controlled Substance Reporting System web site prior to prescribing narcotic medicine for this patient.  I will begin Naprosyn 500 po bid pc.  Return in one month.  Call if any problem.  Precautions discussed.  Electronically Signed Darreld Mclean, MD 9/3/20249:35 AM

## 2023-05-21 ENCOUNTER — Ambulatory Visit: Payer: Self-pay | Admitting: Orthopaedic Surgery

## 2023-07-24 ENCOUNTER — Ambulatory Visit: Payer: Self-pay | Admitting: Family Medicine

## 2023-08-19 ENCOUNTER — Other Ambulatory Visit: Payer: Self-pay | Admitting: Family Medicine

## 2023-08-19 DIAGNOSIS — F419 Anxiety disorder, unspecified: Secondary | ICD-10-CM

## 2023-09-29 ENCOUNTER — Other Ambulatory Visit: Payer: Self-pay | Admitting: Family Medicine

## 2023-09-29 DIAGNOSIS — F32A Depression, unspecified: Secondary | ICD-10-CM

## 2023-10-15 ENCOUNTER — Other Ambulatory Visit: Payer: Self-pay | Admitting: Family Medicine

## 2023-10-15 DIAGNOSIS — F419 Anxiety disorder, unspecified: Secondary | ICD-10-CM

## 2023-11-12 ENCOUNTER — Other Ambulatory Visit: Payer: Self-pay | Admitting: Family Medicine

## 2023-11-12 DIAGNOSIS — F32A Depression, unspecified: Secondary | ICD-10-CM

## 2023-12-11 ENCOUNTER — Other Ambulatory Visit: Payer: Self-pay | Admitting: Family Medicine

## 2023-12-11 DIAGNOSIS — F419 Anxiety disorder, unspecified: Secondary | ICD-10-CM

## 2023-12-20 ENCOUNTER — Other Ambulatory Visit: Payer: Self-pay | Admitting: Family Medicine

## 2023-12-20 DIAGNOSIS — F32A Depression, unspecified: Secondary | ICD-10-CM

## 2024-04-10 ENCOUNTER — Encounter: Payer: Self-pay | Admitting: Radiology

## 2024-06-22 ENCOUNTER — Encounter: Payer: Self-pay | Admitting: Radiology
